# Patient Record
Sex: Female | Born: 1970 | Race: White | Hispanic: No | Marital: Married | Smoking: Never smoker
Health system: Southern US, Community
[De-identification: ages and names within clinical notes are randomized; demographics above are authoritative.]

## PROBLEM LIST (undated history)

## (undated) DIAGNOSIS — R51 Headache: Secondary | ICD-10-CM

## (undated) DIAGNOSIS — N6099 Unspecified benign mammary dysplasia of unspecified breast: Secondary | ICD-10-CM

## (undated) DIAGNOSIS — F419 Anxiety disorder, unspecified: Secondary | ICD-10-CM

## (undated) HISTORY — DX: Unspecified benign mammary dysplasia of unspecified breast: N60.99

---

## 1993-08-17 HISTORY — PX: OTHER SURGICAL HISTORY: SHX169

## 2007-01-13 ENCOUNTER — Encounter: Admission: RE | Admit: 2007-01-13 | Discharge: 2007-01-13 | Payer: Self-pay | Admitting: Family Medicine

## 2008-02-06 ENCOUNTER — Encounter: Admission: RE | Admit: 2008-02-06 | Discharge: 2008-02-06 | Payer: Self-pay | Admitting: Obstetrics and Gynecology

## 2009-02-14 ENCOUNTER — Encounter: Admission: RE | Admit: 2009-02-14 | Discharge: 2009-02-14 | Payer: Self-pay | Admitting: Endocrinology

## 2010-09-07 ENCOUNTER — Encounter: Payer: Self-pay | Admitting: Endocrinology

## 2010-11-17 ENCOUNTER — Other Ambulatory Visit: Payer: Self-pay | Admitting: Obstetrics and Gynecology

## 2010-11-17 DIAGNOSIS — Z1231 Encounter for screening mammogram for malignant neoplasm of breast: Secondary | ICD-10-CM

## 2010-11-25 ENCOUNTER — Ambulatory Visit
Admission: RE | Admit: 2010-11-25 | Discharge: 2010-11-25 | Disposition: A | Discharge status: Home / Self Care | Payer: BLUE CROSS/BLUE SHIELD | Source: Ambulatory Visit | Attending: Obstetrics and Gynecology | Admitting: Obstetrics and Gynecology

## 2010-11-25 DIAGNOSIS — Z1231 Encounter for screening mammogram for malignant neoplasm of breast: Secondary | ICD-10-CM

## 2011-12-16 ENCOUNTER — Other Ambulatory Visit: Payer: Self-pay | Admitting: Obstetrics and Gynecology

## 2011-12-16 DIAGNOSIS — Z1231 Encounter for screening mammogram for malignant neoplasm of breast: Secondary | ICD-10-CM

## 2011-12-29 ENCOUNTER — Ambulatory Visit
Admission: RE | Admit: 2011-12-29 | Discharge: 2011-12-29 | Disposition: A | Discharge status: Home / Self Care | Payer: BLUE CROSS/BLUE SHIELD | Source: Ambulatory Visit | Attending: Obstetrics and Gynecology | Admitting: Obstetrics and Gynecology

## 2011-12-29 DIAGNOSIS — Z1231 Encounter for screening mammogram for malignant neoplasm of breast: Secondary | ICD-10-CM

## 2011-12-31 ENCOUNTER — Other Ambulatory Visit: Payer: Self-pay | Admitting: Obstetrics and Gynecology

## 2011-12-31 DIAGNOSIS — R928 Other abnormal and inconclusive findings on diagnostic imaging of breast: Secondary | ICD-10-CM

## 2012-01-08 ENCOUNTER — Ambulatory Visit
Admission: RE | Admit: 2012-01-08 | Discharge: 2012-01-08 | Disposition: A | Discharge status: Home / Self Care | Payer: BC Managed Care – PPO | Source: Ambulatory Visit | Attending: Obstetrics and Gynecology | Admitting: Obstetrics and Gynecology

## 2012-01-08 ENCOUNTER — Other Ambulatory Visit: Payer: Self-pay | Admitting: Obstetrics and Gynecology

## 2012-01-08 DIAGNOSIS — R921 Mammographic calcification found on diagnostic imaging of breast: Secondary | ICD-10-CM

## 2012-01-08 DIAGNOSIS — R928 Other abnormal and inconclusive findings on diagnostic imaging of breast: Secondary | ICD-10-CM

## 2012-01-14 ENCOUNTER — Ambulatory Visit
Admission: RE | Admit: 2012-01-14 | Discharge: 2012-01-14 | Disposition: A | Discharge status: Home / Self Care | Payer: BC Managed Care – PPO | Source: Ambulatory Visit | Attending: Obstetrics and Gynecology | Admitting: Obstetrics and Gynecology

## 2012-01-14 DIAGNOSIS — R921 Mammographic calcification found on diagnostic imaging of breast: Secondary | ICD-10-CM

## 2012-01-21 ENCOUNTER — Other Ambulatory Visit: Payer: Self-pay | Admitting: Endocrinology

## 2012-01-21 DIAGNOSIS — E049 Nontoxic goiter, unspecified: Secondary | ICD-10-CM

## 2012-02-02 ENCOUNTER — Ambulatory Visit
Admission: RE | Admit: 2012-02-02 | Discharge: 2012-02-02 | Disposition: A | Discharge status: Home / Self Care | Payer: BC Managed Care – PPO | Source: Ambulatory Visit | Attending: Endocrinology | Admitting: Endocrinology

## 2012-02-02 ENCOUNTER — Other Ambulatory Visit: Payer: BC Managed Care – PPO

## 2012-02-02 DIAGNOSIS — E049 Nontoxic goiter, unspecified: Secondary | ICD-10-CM

## 2012-02-09 ENCOUNTER — Other Ambulatory Visit (INDEPENDENT_AMBULATORY_CARE_PROVIDER_SITE_OTHER): Payer: Self-pay | Admitting: Surgery

## 2012-02-09 ENCOUNTER — Ambulatory Visit (INDEPENDENT_AMBULATORY_CARE_PROVIDER_SITE_OTHER): Payer: BC Managed Care – PPO | Admitting: Surgery

## 2012-02-09 ENCOUNTER — Encounter (INDEPENDENT_AMBULATORY_CARE_PROVIDER_SITE_OTHER): Payer: Self-pay | Admitting: Surgery

## 2012-02-09 VITALS — BP 128/70 | HR 68 | Temp 97.4°F | Resp 14 | Ht 62.0 in | Wt 150.5 lb

## 2012-02-09 DIAGNOSIS — N62 Hypertrophy of breast: Secondary | ICD-10-CM

## 2012-02-09 DIAGNOSIS — N6099 Unspecified benign mammary dysplasia of unspecified breast: Secondary | ICD-10-CM

## 2012-02-09 NOTE — Progress Notes (Signed)
Patient ID: Kaitlyn Ward, female   DOB: [birth year], 41 y.o.   MRN: [id number]  Chief Complaint  Patient presents with  . New Evaluation    Atypical lobular hyperplasia    HPI Kaitlyn Ward is a 41 y.o. female.  She is referred by Dr. Mayford Knife for evaluation of atypical cells in the left breast. She had her routine screening mammography which demonstrated abnormal calcifications left breast prompting a biopsy. She is otherwise without complaints. She denies nipple discharge. There is no real family history of breast cancer HPI  Past Medical History  Diagnosis Date  . Atypical lobular hyperplasia of breast     Past Surgical History  Procedure Date  . Fracture repair 1995    left arm    Family History  Problem Relation Age of Onset  . Cancer Father     prostate  . Cancer Paternal Aunt     melanoma  . Cancer Maternal Grandmother     ovarian    Social History History  Substance Use Topics  . Smoking status: Never Smoker   . Smokeless tobacco: Never Used  . Alcohol Use: Yes     occasional    No Known Allergies  Current Outpatient Prescriptions  Medication Sig Dispense Refill  . Cholecalciferol (VITAMIN D) 2000 UNITS CAPS Take by mouth as needed.      . Coenzyme Q10 (COQ-10) 400 MG CAPS Take by mouth as needed.      Marland Kitchen escitalopram (LEXAPRO) 20 MG tablet Take 20 mg by mouth daily.      . Lecithin 1200 MG CAPS Take by mouth as needed.      Marland Kitchen lisdexamfetamine (VYVANSE) 20 MG capsule Take 20 mg by mouth as needed.      . Omega-3 Fatty Acids 500 MG CPDR Take by mouth as needed.      . Probiotic Product (PROBIOTIC FORMULA PO) Take by mouth as needed. Super Bifido Plus        Review of Systems Review of Systems  Constitutional: Negative for fever, chills and unexpected weight change.  HENT: Negative for hearing loss, congestion, sore throat, trouble swallowing and voice change.   Eyes: Negative for visual disturbance.  Respiratory: Negative for cough and wheezing.     Cardiovascular: Negative for chest pain, palpitations and leg swelling.  Gastrointestinal: Negative for nausea, vomiting, abdominal pain, diarrhea, constipation, blood in stool, abdominal distention and anal bleeding.  Genitourinary: Negative for hematuria, vaginal bleeding and difficulty urinating.  Musculoskeletal: Negative for arthralgias.  Skin: Negative for rash and wound.  Neurological: Negative for seizures, syncope and headaches.  Hematological: Negative for adenopathy. Does not bruise/bleed easily.  Psychiatric/Behavioral: Negative for confusion.    Blood pressure 128/70, pulse 68, temperature 97.4 F (36.3 C), temperature source Temporal, resp. rate 14, height 5\' 2"  (1.575 m), weight 150 lb 8 oz (68.266 kg).  Physical Exam Physical Exam  Constitutional: She is oriented to person, place, and time. She appears well-developed and well-nourished. No distress.  HENT:  Head: Normocephalic and atraumatic.  Right Ear: External ear normal.  Left Ear: External ear normal.  Nose: Nose normal.  Mouth/Throat: Oropharynx is clear and moist.  Eyes: Conjunctivae and EOM are normal. Pupils are equal, round, and reactive to light.  Neck: Normal range of motion. Neck supple. No tracheal deviation present. No thyromegaly present.  Cardiovascular: Normal rate, regular rhythm, normal heart sounds and intact distal pulses.   No murmur heard. Pulmonary/Chest: Effort normal and breath sounds normal. No respiratory distress.  She has no wheezes. She has no rales.  Lymphadenopathy:    She has no cervical adenopathy.    She has no axillary adenopathy.  Neurological: She is alert and oriented to person, place, and time.  Skin: Skin is warm and dry. No rash noted. She is not diaphoretic. No pallor.  Psychiatric: Her behavior is normal. Judgment normal.  Breast: There is no palpable mass in left breast. Her biopsy incision is well-healed. Areola is normal  Data Reviewed I have reviewed the pathology  demonstrating atypical lobular hyperplasia of left breast biopsy specimen. I have reviewed the mammograms as well.  Assessment    Atypical lobular hyperplasia of left breast    Plan    Needle localized excision of this area is recommended to rule out malignancy. I discussed this with the patient and her husband in detail. I discussed the risks of surgery which includes but is not limited to bleeding, infection, need for further surgery, et Karie Soda. She understands and wishes to proceed. Surgery plus the schedule. Likelihood of success is good       Adarrius Graeff A 02/09/2012, 11:35 AM

## 2012-03-14 ENCOUNTER — Encounter (HOSPITAL_COMMUNITY)
Admission: RE | Admit: 2012-03-14 | Discharge: 2012-03-14 | Disposition: A | Discharge status: Home / Self Care | Payer: BC Managed Care – PPO | Source: Ambulatory Visit | Attending: Surgery | Admitting: Surgery

## 2012-03-14 ENCOUNTER — Encounter (HOSPITAL_COMMUNITY): Payer: Self-pay

## 2012-03-14 HISTORY — DX: Anxiety disorder, unspecified: F41.9

## 2012-03-14 HISTORY — DX: Headache: R51

## 2012-03-14 LAB — CBC
HCT: 39.3 % (ref 36.0–46.0)
Hemoglobin: 13.3 g/dL (ref 12.0–15.0)
MCHC: 33.8 g/dL (ref 30.0–36.0)
MCV: 88.7 fL (ref 78.0–100.0)
RDW: 12.3 % (ref 11.5–15.5)

## 2012-03-14 LAB — BASIC METABOLIC PANEL
BUN: 13 mg/dL (ref 6–23)
CO2: 31 mEq/L (ref 19–32)
Chloride: 101 mEq/L (ref 96–112)
Creatinine, Ser: 0.88 mg/dL (ref 0.50–1.10)
GFR calc Af Amer: >90 mL/min (ref >90)
Glucose, Bld: 108 mg/dL — ABNORMAL HIGH (ref 70–99)
Potassium: 4.6 mEq/L (ref 3.5–5.1)

## 2012-03-14 LAB — SURGICAL PCR SCREEN: Staphylococcus aureus: POSITIVE — AB

## 2012-03-14 NOTE — Pre-Procedure Instructions (Signed)
20 Kaitlyn Ward  03/14/2012   Your procedure is scheduled on: Friday , August 2nd.  Report to [facility]   after your appointment at the [facility] at 7:30AM.  Call this number if you have problems the morning of surgery: [phone]   Remember:   Do not eat food or drink any liquid:  After Midnight.   Take these medicines the morning of surgery with A SIP OF WATER: Escitalopram (Lexapro) Do not take any Aspirin, NSAIDs , Coumadin, Plavix, Effient or Herbal medications.   Do not wear jewelry, make-up or Ward polish.  Do not wear lotions, powders, or perfumes. You may wear deodorant.  Do not shave 48 hours prior to surgery. Men may shave face and neck.  Do not bring valuables to the hospital.  Contacts, dentures or bridgework may not be worn into surgery.  Leave suitcase in the car. After surgery it may be brought to your room.  For patients admitted to the hospital, checkout time is 11:00 AM the day of discharge.   Patients discharged the day of surgery will not be allowed to drive home.  Name and phone number of your driver: __________________  Special Instructions: CHG Shower Use Special Wash: 1/2 bottle night before surgery and 1/2 bottle morning of surgery.   Please read over the following fact sheets that you were given: Pain Booklet, Coughing and Deep Breathing and Surgical Site Infection Prevention

## 2012-03-17 MED ORDER — CEFAZOLIN SODIUM-DEXTROSE 2-3 GM-% IV SOLR
2.0000 g | INTRAVENOUS | Status: AC
  Administered 2012-03-18: 2 g via INTRAVENOUS
  Filled 2012-03-17: qty 50

## 2012-03-17 MED ORDER — CEFAZOLIN SODIUM 1-5 GM-% IV SOLN: 1.0000 g | INTRAVENOUS | Status: DC

## 2012-03-17 NOTE — H&P (Signed)
Patient ID: Kaitlyn Ward, female DOB: 06/13/1971, 41 y.o. MRN: [id number]  Chief Complaint   Patient presents with   .  New Evaluation     Atypical lobular hyperplasia    HPI  Kaitlyn Ward is a 41 y.o. female. She is referred by Dr. Mayford Knife for evaluation of atypical cells in the left breast. She had her routine screening mammography which demonstrated abnormal calcifications left breast prompting a biopsy. She is otherwise without complaints. She denies nipple discharge. There is no real family history of breast cancer  HPI  Past Medical History   Diagnosis  Date   .  Atypical lobular hyperplasia of breast     Past Surgical History   Procedure  Date   .  Fracture repair  1995     left arm    Family History   Problem  Relation  Age of Onset   .  Cancer  Father       prostate    .  Cancer  Paternal Aunt       melanoma    .  Cancer  Maternal Grandmother       ovarian    Social History  History   Substance Use Topics   .  Smoking status:  Never Smoker   .  Smokeless tobacco:  Never Used   .  Alcohol Use:  Yes      occasional    No Known Allergies  Current Outpatient Prescriptions   Medication  Sig  Dispense  Refill   .  Cholecalciferol (VITAMIN D) 2000 UNITS CAPS  Take by mouth as needed.     .  Coenzyme Q10 (COQ-10) 400 MG CAPS  Take by mouth as needed.     Marland Kitchen  escitalopram (LEXAPRO) 20 MG tablet  Take 20 mg by mouth daily.     .  Lecithin 1200 MG CAPS  Take by mouth as needed.     Marland Kitchen  lisdexamfetamine (VYVANSE) 20 MG capsule  Take 20 mg by mouth as needed.     .  Omega-3 Fatty Acids 500 MG CPDR  Take by mouth as needed.     .  Probiotic Product (PROBIOTIC FORMULA PO)  Take by mouth as needed. Super Bifido Plus      Review of Systems  Review of Systems  Constitutional: Negative for fever, chills and unexpected weight change.  HENT: Negative for hearing loss, congestion, sore throat, trouble swallowing and voice change.  Eyes: Negative for visual disturbance.    Respiratory: Negative for cough and wheezing.  Cardiovascular: Negative for chest pain, palpitations and leg swelling.  Gastrointestinal: Negative for nausea, vomiting, abdominal pain, diarrhea, constipation, blood in stool, abdominal distention and anal bleeding.  Genitourinary: Negative for hematuria, vaginal bleeding and difficulty urinating.  Musculoskeletal: Negative for arthralgias.  Skin: Negative for rash and wound.  Neurological: Negative for seizures, syncope and headaches.  Hematological: Negative for adenopathy. Does not bruise/bleed easily.  Psychiatric/Behavioral: Negative for confusion.   Blood pressure 128/70, pulse 68, temperature 97.4 F (36.3 C), temperature source Temporal, resp. rate 14, height 5\' 2"  (1.575 m), weight 150 lb 8 oz (68.266 kg).  Physical Exam  Physical Exam  Constitutional: She is oriented to person, place, and time. She appears well-developed and well-nourished. No distress.  HENT:  Head: Normocephalic and atraumatic.  Right Ear: External ear normal.  Left Ear: External ear normal.  Nose: Nose normal.  Mouth/Throat: Oropharynx is clear and moist.  Eyes: Conjunctivae and EOM  are normal. Pupils are equal, round, and reactive to light.  Neck: Normal range of motion. Neck supple. No tracheal deviation present. No thyromegaly present.  Cardiovascular: Normal rate, regular rhythm, normal heart sounds and intact distal pulses.  No murmur heard.  Pulmonary/Chest: Effort normal and breath sounds normal. No respiratory distress. She has no wheezes. She has no rales.  Lymphadenopathy:  She has no cervical adenopathy.  She has no axillary adenopathy.  Neurological: She is alert and oriented to person, place, and time.  Skin: Skin is warm and dry. No rash noted. She is not diaphoretic. No pallor.  Psychiatric: Her behavior is normal. Judgment normal.  Breast: There is no palpable mass in left breast. Her biopsy incision is well-healed. Areola is normal  Data  Reviewed  I have reviewed the pathology demonstrating atypical lobular hyperplasia of left breast biopsy specimen. I have reviewed the mammograms as well.  Assessment   Atypical lobular hyperplasia of left breast   Plan   Needle localized excision of this area is recommended to rule out malignancy. I discussed this with the patient and her husband in detail. I discussed the risks of surgery which includes but is not limited to bleeding, infection, need for further surgery, et Karie Soda. She understands and wishes to proceed. Surgery plus the schedule. Likelihood of success is good   Stellah Donovan A

## 2012-03-18 ENCOUNTER — Ambulatory Visit (HOSPITAL_COMMUNITY): Payer: BC Managed Care – PPO | Admitting: Anesthesiology

## 2012-03-18 ENCOUNTER — Encounter (HOSPITAL_COMMUNITY): Payer: Self-pay | Admitting: Anesthesiology

## 2012-03-18 ENCOUNTER — Encounter (HOSPITAL_COMMUNITY)
Admission: RE | Disposition: A | Discharge status: Home / Self Care | Payer: Self-pay | Source: Ambulatory Visit | Attending: Surgery

## 2012-03-18 ENCOUNTER — Ambulatory Visit
Admission: RE | Admit: 2012-03-18 | Discharge: 2012-03-18 | Disposition: A | Discharge status: Home / Self Care | Payer: BC Managed Care – PPO | Source: Ambulatory Visit | Attending: Surgery | Admitting: Surgery

## 2012-03-18 ENCOUNTER — Encounter (HOSPITAL_COMMUNITY): Payer: Self-pay | Admitting: *Deleted

## 2012-03-18 ENCOUNTER — Ambulatory Visit (HOSPITAL_COMMUNITY)
Admission: RE | Admit: 2012-03-18 | Discharge: 2012-03-18 | Disposition: A | Discharge status: Home / Self Care | Payer: BC Managed Care – PPO | Source: Ambulatory Visit | Attending: Surgery | Admitting: Surgery

## 2012-03-18 DIAGNOSIS — N6019 Diffuse cystic mastopathy of unspecified breast: Secondary | ICD-10-CM

## 2012-03-18 DIAGNOSIS — R928 Other abnormal and inconclusive findings on diagnostic imaging of breast: Secondary | ICD-10-CM | POA: Insufficient documentation

## 2012-03-18 DIAGNOSIS — N6099 Unspecified benign mammary dysplasia of unspecified breast: Secondary | ICD-10-CM

## 2012-03-18 DIAGNOSIS — Z01812 Encounter for preprocedural laboratory examination: Secondary | ICD-10-CM | POA: Insufficient documentation

## 2012-03-18 DIAGNOSIS — R51 Headache: Secondary | ICD-10-CM | POA: Insufficient documentation

## 2012-03-18 DIAGNOSIS — Z01818 Encounter for other preprocedural examination: Secondary | ICD-10-CM | POA: Insufficient documentation

## 2012-03-18 LAB — GLUCOSE, CAPILLARY: Glucose-Capillary: 83 mg/dL (ref 70–99)

## 2012-03-18 SURGERY — BREAST LUMPECTOMY WITH NEEDLE LOCALIZATION
Anesthesia: General | Site: Breast | Laterality: Left | Wound class: Clean

## 2012-03-18 MED ORDER — LIDOCAINE HCL (CARDIAC) 20 MG/ML IV SOLN
INTRAVENOUS | Status: DC | PRN
  Administered 2012-03-18: 70 mg via INTRAVENOUS

## 2012-03-18 MED ORDER — OXYCODONE HCL 5 MG PO TABS: 5.0000 mg | ORAL_TABLET | ORAL | Status: DC | PRN

## 2012-03-18 MED ORDER — BUPIVACAINE-EPINEPHRINE 0.25% -1:200000 IJ SOLN
INTRAMUSCULAR | Status: DC | PRN
  Administered 2012-03-18: 20 mL

## 2012-03-18 MED ORDER — ONDANSETRON HCL 4 MG/2ML IJ SOLN
INTRAMUSCULAR | Status: DC | PRN
  Administered 2012-03-18: 4 mg via INTRAVENOUS

## 2012-03-18 MED ORDER — ACETAMINOPHEN 650 MG RE SUPP: 650.0000 mg | RECTAL | Status: DC | PRN

## 2012-03-18 MED ORDER — MORPHINE SULFATE 2 MG/ML IJ SOLN: 2.0000 mg | INTRAMUSCULAR | Status: DC | PRN

## 2012-03-18 MED ORDER — ONDANSETRON HCL 4 MG/2ML IJ SOLN: 4.0000 mg | Freq: Four times a day (QID) | INTRAMUSCULAR | Status: DC | PRN

## 2012-03-18 MED ORDER — HYDROMORPHONE HCL PF 1 MG/ML IJ SOLN: 0.2500 mg | INTRAMUSCULAR | Status: DC | PRN

## 2012-03-18 MED ORDER — MIDAZOLAM HCL 5 MG/5ML IJ SOLN
INTRAMUSCULAR | Status: DC | PRN
  Administered 2012-03-18: 2 mg via INTRAVENOUS

## 2012-03-18 MED ORDER — FENTANYL CITRATE 0.05 MG/ML IJ SOLN
INTRAMUSCULAR | Status: DC | PRN
  Administered 2012-03-18: 50 ug via INTRAVENOUS
  Administered 2012-03-18: 100 ug via INTRAVENOUS

## 2012-03-18 MED ORDER — SODIUM CHLORIDE 0.9 % IV SOLN: 250.0000 mL | INTRAVENOUS | Status: DC | PRN

## 2012-03-18 MED ORDER — SODIUM CHLORIDE 0.9 % IJ SOLN: 3.0000 mL | INTRAMUSCULAR | Status: DC | PRN

## 2012-03-18 MED ORDER — GLYCOPYRROLATE 0.2 MG/ML IJ SOLN
INTRAMUSCULAR | Status: DC | PRN
  Administered 2012-03-18: 0.2 mg via INTRAVENOUS

## 2012-03-18 MED ORDER — ACETAMINOPHEN 325 MG PO TABS: 650.0000 mg | ORAL_TABLET | ORAL | Status: DC | PRN

## 2012-03-18 MED ORDER — 0.9 % SODIUM CHLORIDE (POUR BTL) OPTIME
TOPICAL | Status: DC | PRN
  Administered 2012-03-18: 1000 mL

## 2012-03-18 MED ORDER — PROPOFOL 10 MG/ML IV EMUL
INTRAVENOUS | Status: DC | PRN
  Administered 2012-03-18: 200 mg via INTRAVENOUS

## 2012-03-18 MED ORDER — HYDROCODONE-ACETAMINOPHEN 5-325 MG PO TABS: 1.0000 | ORAL_TABLET | ORAL | Status: AC | PRN

## 2012-03-18 MED ORDER — KETOROLAC TROMETHAMINE 30 MG/ML IJ SOLN
INTRAMUSCULAR | Status: DC | PRN
  Administered 2012-03-18: 30 mg via INTRAVENOUS

## 2012-03-18 MED ORDER — LACTATED RINGERS IV SOLN
INTRAVENOUS | Status: DC
  Administered 2012-03-18: 09:00:00 via INTRAVENOUS

## 2012-03-18 MED ORDER — LACTATED RINGERS IV SOLN
INTRAVENOUS | Status: DC | PRN
  Administered 2012-03-18: 09:00:00 via INTRAVENOUS

## 2012-03-18 MED ORDER — BUPIVACAINE-EPINEPHRINE PF 0.25-1:200000 % IJ SOLN
INTRAMUSCULAR | Status: AC
  Filled 2012-03-18: qty 30

## 2012-03-18 MED ORDER — SODIUM CHLORIDE 0.9 % IJ SOLN: 3.0000 mL | Freq: Two times a day (BID) | INTRAMUSCULAR | Status: DC

## 2012-03-18 SURGICAL SUPPLY — 41 items
APL SKNCLS STERI-STRIP NONHPOA (GAUZE/BANDAGES/DRESSINGS) ×1
BENZOIN TINCTURE PRP APPL 2/3 (GAUZE/BANDAGES/DRESSINGS) ×2 IMPLANT
BLADE SURG 10 STRL SS (BLADE) ×2 IMPLANT
BLADE SURG 15 STRL LF DISP TIS (BLADE) ×1 IMPLANT
BLADE SURG 15 STRL SS (BLADE) ×2
CANISTER SUCTION 2500CC (MISCELLANEOUS) ×1 IMPLANT
CHLORAPREP W/TINT 26ML (MISCELLANEOUS) ×2 IMPLANT
CLOTH BEACON ORANGE TIMEOUT ST (SAFETY) ×2 IMPLANT
COVER SURGICAL LIGHT HANDLE (MISCELLANEOUS) ×2 IMPLANT
DEVICE DUBIN SPECIMEN MAMMOGRA (MISCELLANEOUS) ×2 IMPLANT
DRAPE CHEST BREAST 15X10 FENES (DRAPES) ×2 IMPLANT
DRSG TEGADERM 4X4.75 (GAUZE/BANDAGES/DRESSINGS) ×2 IMPLANT
ELECT CAUTERY BLADE 6.4 (BLADE) ×2 IMPLANT
ELECT REM PT RETURN 9FT ADLT (ELECTROSURGICAL) ×2
ELECTRODE REM PT RTRN 9FT ADLT (ELECTROSURGICAL) ×1 IMPLANT
GLOVE BIO SURGEON STRL SZ7.5 (GLOVE) ×1 IMPLANT
GLOVE BIOGEL PI IND STRL 7.5 (GLOVE) IMPLANT
GLOVE BIOGEL PI INDICATOR 7.5 (GLOVE) ×2
GLOVE SURG SIGNA 7.5 PF LTX (GLOVE) ×2 IMPLANT
GOWN PREVENTION PLUS XLARGE (GOWN DISPOSABLE) ×2 IMPLANT
GOWN STRL NON-REIN LRG LVL3 (GOWN DISPOSABLE) ×3 IMPLANT
KIT BASIN OR (CUSTOM PROCEDURE TRAY) ×2 IMPLANT
KIT MARKER MARGIN INK (KITS) ×1 IMPLANT
KIT ROOM TURNOVER OR (KITS) ×2 IMPLANT
NS IRRIG 1000ML POUR BTL (IV SOLUTION) ×2 IMPLANT
PACK SURGICAL SETUP 50X90 (CUSTOM PROCEDURE TRAY) ×2 IMPLANT
PAD ARMBOARD 7.5X6 YLW CONV (MISCELLANEOUS) ×2 IMPLANT
PENCIL BUTTON HOLSTER BLD 10FT (ELECTRODE) ×2 IMPLANT
SPONGE GAUZE 4X4 12PLY (GAUZE/BANDAGES/DRESSINGS) ×2 IMPLANT
SPONGE LAP 4X18 X RAY DECT (DISPOSABLE) ×2 IMPLANT
STRIP CLOSURE SKIN 1/2X4 (GAUZE/BANDAGES/DRESSINGS) ×2 IMPLANT
SUT MON AB 4-0 PC3 18 (SUTURE) ×2 IMPLANT
SUT SILK 2 0 SH (SUTURE) IMPLANT
SUT VIC AB 3-0 SH 27 (SUTURE) ×2
SUT VIC AB 3-0 SH 27XBRD (SUTURE) ×1 IMPLANT
SYR BULB 3OZ (MISCELLANEOUS) ×2 IMPLANT
SYR CONTROL 10ML LL (SYRINGE) ×2 IMPLANT
TOWEL OR 17X24 6PK STRL BLUE (TOWEL DISPOSABLE) ×2 IMPLANT
TOWEL OR 17X26 10 PK STRL BLUE (TOWEL DISPOSABLE) ×1 IMPLANT
TUBE CONNECTING 12X1/4 (SUCTIONS) ×1 IMPLANT
YANKAUER SUCT BULB TIP NO VENT (SUCTIONS) ×1 IMPLANT

## 2012-03-18 NOTE — Transfer of Care (Signed)
Immediate Anesthesia Transfer of Care Note  Patient: Kaitlyn Ward  Procedure(s) Performed: Procedure(s) (LRB): BREAST LUMPECTOMY WITH NEEDLE LOCALIZATION (Left)  Patient Location: PACU  Anesthesia Type: General  Level of Consciousness: awake and alert   Airway & Oxygen Therapy: Patient Spontanous Breathing and Patient connected to nasal cannula oxygen  Post-op Assessment: Report given to PACU RN and Post -op Vital signs reviewed and stable  Post vital signs: Reviewed and stable  Complications: No apparent anesthesia complications

## 2012-03-18 NOTE — Anesthesia Preprocedure Evaluation (Addendum)
Anesthesia Evaluation  Patient identified by MRN, date of birth, ID band Patient awake    Reviewed: Allergy & Precautions, H&P , NPO status , Patient's Chart, lab work & pertinent test results  Airway Mallampati: II      Dental   Pulmonary  breath sounds clear to auscultation        Cardiovascular negative cardio ROS  Rhythm:Regular Rate:Normal     Neuro/Psych  Headaches,    GI/Hepatic negative GI ROS, Neg liver ROS,   Endo/Other  negative endocrine ROSType 2Borderline DM  Renal/GU negative Renal ROS     Musculoskeletal   Abdominal   Peds  Hematology   Anesthesia Other Findings   Reproductive/Obstetrics                          Anesthesia Physical Anesthesia Plan  ASA: III  Anesthesia Plan: General   Post-op Pain Management:    Induction: Intravenous  Airway Management Planned: LMA  Additional Equipment:   Intra-op Plan:   Post-operative Plan: Extubation in OR  Informed Consent:   Dental advisory given  Plan Discussed with: CRNA, Anesthesiologist and Surgeon  Anesthesia Plan Comments:         Anesthesia Quick Evaluation

## 2012-03-18 NOTE — Op Note (Signed)
BREAST LUMPECTOMY WITH NEEDLE LOCALIZATION  Procedure Note  Kaitlyn Ward 03/18/2012   Pre-op Diagnosis: ATYPICAL LYMPHOID CELLS, LEFT BREAST     Post-op Diagnosis: same  Procedure(s): LEFT BREAST LUMPECTOMY WITH NEEDLE LOCALIZATION  Surgeon(s): Shelly Rubenstein, MD  Anesthesia: General  Staff:  Pauletta Browns, RN - Circulator Janeece Agee Pingue, CST - Scrub Person  Estimated Blood Loss: Minimal               Specimens: left breast   Indications: This is Ward 41 year old female who had Ward recent left breast biopsy by stereotactic guidance which showed atypical lymphoid hyperplasia. The decision has been made to proceed with needle localized lumpectomy  Procedure: The patient had Ward localization wire placed in the left breast at the breast center. She was then taken to the operating room. She was placed upon the operative table and general anesthesia was induced. She was identified as the correct patient. Her left breast and prepped and draped in the usual sterile fashion. I made Ward longitudinal incision across the breast in the lower outer quadrant at the level of the wire. I did this down to breast tissue with electrocautery. I then performed Ward wide lumpectomy going down to the chest wall around the localization wire. Adequate margins appear to be achieved. I then completely removed the specimen with the cautery. All margins were marked for pain. X-ray confirmed the suspicious area was in the lumpectomy specimen. It was then sent to pathology for evaluation. I then irrigated with saline achieve hemostasis the cautery. I anesthetized the area circumferentially with Marcaine. I then closed subcutaneous tissue with interrupted 3-0 Vicryl sutures and closed the skin with Ward running 4-0 Monocryl. Steri-Strips, gauze, and Tegaderm were then applied. The patient tolerated the procedure well. All sponge tenderness counts were correct at the end of the procedure. The patient was then extubated  in the operating room and taken in Ward stable condition to the recovery room.        Kaitlyn Ward   Date: 03/18/2012  Time: 10:09 AM

## 2012-03-18 NOTE — Anesthesia Postprocedure Evaluation (Signed)
  Anesthesia Post-op Note  Patient: Kaitlyn Ward  Procedure(s) Performed: Procedure(s) (LRB): BREAST LUMPECTOMY WITH NEEDLE LOCALIZATION (Left)  Patient Location: PACU  Anesthesia Type: General  Level of Consciousness: awake  Airway and Oxygen Therapy: Patient Spontanous Breathing  Post-op Pain: mild  Post-op Assessment: Post-op Vital signs reviewed  Post-op Vital Signs: Reviewed  Complications: No apparent anesthesia complications

## 2012-03-18 NOTE — Preoperative (Signed)
Beta Blockers   Reason not to administer Beta Blockers:Not Applicable 

## 2012-03-18 NOTE — Anesthesia Procedure Notes (Signed)
Procedure Name: LMA Insertion Date/Time: 03/18/2012 9:36 AM Performed by: Gwenyth Allegra Pre-anesthesia Checklist: Patient identified, Timeout performed, Emergency Drugs available, Suction available and Patient being monitored Patient Re-evaluated:Patient Re-evaluated prior to inductionOxygen Delivery Method: Circle system utilized Intubation Type: IV induction LMA: LMA inserted LMA Size: 4.0 Number of attempts: 1 Placement Confirmation: positive ETCO2 and breath sounds checked- equal and bilateral Tube secured with: Tape Dental Injury: Teeth and Oropharynx as per pre-operative assessment

## 2012-03-18 NOTE — Interval H&P Note (Signed)
History and Physical Interval Note: no change in h and p 03/18/2012 8:59 AM  [name]  has presented today for surgery, with the diagnosis of atypical cells left breast  The various methods of treatment have been discussed with the patient and family. After consideration of risks, benefits and other options for treatment, the patient has consented to  Procedure(s) (LRB): BREAST LUMPECTOMY WITH NEEDLE LOCALIZATION (Left) as Ward surgical intervention .  The patient's history has been reviewed, patient examined, no change in status, stable for surgery.  I have reviewed the patient's chart and labs.  Questions were answered to the patient's satisfaction.     Kaitlyn Ward

## 2012-04-25 ENCOUNTER — Encounter (INDEPENDENT_AMBULATORY_CARE_PROVIDER_SITE_OTHER): Payer: BC Managed Care – PPO | Admitting: Surgery

## 2012-05-09 ENCOUNTER — Encounter (INDEPENDENT_AMBULATORY_CARE_PROVIDER_SITE_OTHER): Payer: BC Managed Care – PPO | Admitting: Surgery

## 2012-11-24 ENCOUNTER — Other Ambulatory Visit: Payer: Self-pay | Admitting: Obstetrics and Gynecology

## 2012-11-24 DIAGNOSIS — Z139 Encounter for screening, unspecified: Secondary | ICD-10-CM

## 2013-01-03 ENCOUNTER — Ambulatory Visit (INDEPENDENT_AMBULATORY_CARE_PROVIDER_SITE_OTHER): Payer: BC Managed Care – PPO

## 2013-01-03 DIAGNOSIS — Z139 Encounter for screening, unspecified: Secondary | ICD-10-CM

## 2013-01-03 DIAGNOSIS — Z1231 Encounter for screening mammogram for malignant neoplasm of breast: Secondary | ICD-10-CM

## 2013-10-11 ENCOUNTER — Emergency Department (HOSPITAL_BASED_OUTPATIENT_CLINIC_OR_DEPARTMENT_OTHER)
Admission: EM | Admit: 2013-10-11 | Discharge: 2013-10-11 | Disposition: A | Discharge status: Home / Self Care | Payer: BC Managed Care – PPO | Attending: Emergency Medicine | Admitting: Emergency Medicine

## 2013-10-11 ENCOUNTER — Encounter (HOSPITAL_BASED_OUTPATIENT_CLINIC_OR_DEPARTMENT_OTHER): Payer: Self-pay | Admitting: Emergency Medicine

## 2013-10-11 DIAGNOSIS — K5289 Other specified noninfective gastroenteritis and colitis: Secondary | ICD-10-CM | POA: Insufficient documentation

## 2013-10-11 DIAGNOSIS — K529 Noninfective gastroenteritis and colitis, unspecified: Secondary | ICD-10-CM

## 2013-10-11 DIAGNOSIS — Z8742 Personal history of other diseases of the female genital tract: Secondary | ICD-10-CM | POA: Insufficient documentation

## 2013-10-11 DIAGNOSIS — Z79899 Other long term (current) drug therapy: Secondary | ICD-10-CM | POA: Insufficient documentation

## 2013-10-11 DIAGNOSIS — E119 Type 2 diabetes mellitus without complications: Secondary | ICD-10-CM | POA: Insufficient documentation

## 2013-10-11 DIAGNOSIS — R51 Headache: Secondary | ICD-10-CM | POA: Insufficient documentation

## 2013-10-11 LAB — URINE MICROSCOPIC-ADD ON

## 2013-10-11 LAB — COMPREHENSIVE METABOLIC PANEL
ALK PHOS: 74 U/L (ref 39–117)
ALT: 16 U/L (ref 0–35)
AST: 21 U/L (ref 0–37)
Albumin: 4.5 g/dL (ref 3.5–5.2)
BUN: 23 mg/dL (ref 6–23)
CO2: 25 mEq/L (ref 19–32)
Calcium: 9.8 mg/dL (ref 8.4–10.5)
Chloride: 100 mEq/L (ref 96–112)
Creatinine, Ser: 1 mg/dL (ref 0.50–1.10)
GFR, EST AFRICAN AMERICAN: 79 mL/min — AB (ref >90)
GFR, EST NON AFRICAN AMERICAN: 68 mL/min — AB (ref >90)
GLUCOSE: 117 mg/dL — AB (ref 70–99)
POTASSIUM: 4.1 meq/L (ref 3.7–5.3)
Sodium: 140 mEq/L (ref 137–147)
TOTAL PROTEIN: 8.4 g/dL — AB (ref 6.0–8.3)
Total Bilirubin: 0.4 mg/dL (ref 0.3–1.2)

## 2013-10-11 LAB — I-STAT CG4 LACTIC ACID, ED: Lactic Acid, Venous: 0.9 mmol/L (ref 0.5–2.2)

## 2013-10-11 LAB — URINALYSIS, ROUTINE W REFLEX MICROSCOPIC
BILIRUBIN URINE: NEGATIVE
Glucose, UA: NEGATIVE mg/dL
KETONES UR: 15 mg/dL — AB
Leukocytes, UA: NEGATIVE
NITRITE: NEGATIVE
PROTEIN: NEGATIVE mg/dL
SPECIFIC GRAVITY, URINE: 1.018 (ref 1.005–1.030)
UROBILINOGEN UA: 0.2 mg/dL (ref 0.0–1.0)
pH: 5.5 (ref 5.0–8.0)

## 2013-10-11 LAB — CBC
HEMATOCRIT: 43.9 % (ref 36.0–46.0)
HEMOGLOBIN: 14.9 g/dL (ref 12.0–15.0)
MCH: 29.9 pg (ref 26.0–34.0)
MCHC: 33.9 g/dL (ref 30.0–36.0)
MCV: 88 fL (ref 78.0–100.0)
Platelets: 223 10*3/uL (ref 150–400)
RBC: 4.99 MIL/uL (ref 3.87–5.11)
RDW: 12.8 % (ref 11.5–15.5)
WBC: 9.2 10*3/uL (ref 4.0–10.5)

## 2013-10-11 LAB — LIPASE, BLOOD: LIPASE: 25 U/L (ref 11–59)

## 2013-10-11 MED ORDER — ONDANSETRON 4 MG PO TBDP: ORAL_TABLET | ORAL | Status: AC

## 2013-10-11 MED ORDER — SODIUM CHLORIDE 0.9 % IV BOLUS (SEPSIS)
2000.0000 mL | Freq: Once | INTRAVENOUS | Status: AC
  Administered 2013-10-11: 2000 mL via INTRAVENOUS

## 2013-10-11 MED ORDER — ONDANSETRON HCL 4 MG/2ML IJ SOLN
4.0000 mg | Freq: Once | INTRAMUSCULAR | Status: AC
  Administered 2013-10-11: 4 mg via INTRAVENOUS
  Filled 2013-10-11: qty 2

## 2013-10-11 MED ORDER — GI COCKTAIL ~~LOC~~
30.0000 mL | Freq: Once | ORAL | Status: AC
  Administered 2013-10-11: 30 mL via ORAL
  Filled 2013-10-11: qty 30

## 2013-10-11 MED ORDER — HYDROCOD POLST-CHLORPHEN POLST 10-8 MG/5ML PO LQCR: 5.0000 mL | Freq: Two times a day (BID) | ORAL | Status: AC | PRN

## 2013-10-11 NOTE — Discharge Instructions (Signed)
Viral Gastroenteritis Viral gastroenteritis is also known as stomach flu. This condition affects the stomach and intestinal tract. It can cause sudden diarrhea and vomiting. The illness typically lasts 3 to 8 days. Most people develop an immune response that eventually gets rid of the virus. While this natural response develops, the virus can make you quite ill. CAUSES  Many different viruses can cause gastroenteritis, such as rotavirus or noroviruses. You can catch one of these viruses by consuming contaminated food or water. You may also catch a virus by sharing utensils or other personal items with an infected person or by touching a contaminated surface. SYMPTOMS  The most common symptoms are diarrhea and vomiting. These problems can cause a severe loss of body fluids (dehydration) and a body salt (electrolyte) imbalance. Other symptoms may include:  Fever.  Headache.  Fatigue.  Abdominal pain. DIAGNOSIS  Your caregiver can usually diagnose viral gastroenteritis based on your symptoms and a physical exam. A stool sample may also be taken to test for the presence of viruses or other infections. TREATMENT  This illness typically goes away on its own. Treatments are aimed at rehydration. The most serious cases of viral gastroenteritis involve vomiting so severely that you are not able to keep fluids down. In these cases, fluids must be given through an intravenous line (IV). HOME CARE INSTRUCTIONS   Drink enough fluids to keep your urine clear or pale yellow. Drink small amounts of fluids frequently and increase the amounts as tolerated.  Ask your caregiver for specific rehydration instructions.  Avoid:  Foods high in sugar.  Alcohol.  Carbonated drinks.  Tobacco.  Juice.  Caffeine drinks.  Extremely hot or cold fluids.  Fatty, greasy foods.  Too much intake of anything at one time.  Dairy products until 24 to 48 hours after diarrhea stops.  You may consume probiotics.  Probiotics are active cultures of beneficial bacteria. They may lessen the amount and number of diarrheal stools in adults. Probiotics can be found in yogurt with active cultures and in supplements.  Wash your hands well to avoid spreading the virus.  Only take over-the-counter or prescription medicines for pain, discomfort, or fever as directed by your caregiver. Do not give aspirin to children. Antidiarrheal medicines are not recommended.  Ask your caregiver if you should continue to take your regular prescribed and over-the-counter medicines.  Keep all follow-up appointments as directed by your caregiver. SEEK IMMEDIATE MEDICAL CARE IF:   You are unable to keep fluids down.  You do not urinate at least once every 6 to 8 hours.  You develop shortness of breath.  You notice blood in your stool or vomit. This may look like coffee grounds.  You have abdominal pain that increases or is concentrated in one small area (localized).  You have persistent vomiting or diarrhea.  You have a fever.  The patient is a child younger than 3 months, and he or she has a fever.  The patient is a child older than 3 months, and he or she has a fever and persistent symptoms.  The patient is a child older than 3 months, and he or she has a fever and symptoms suddenly get worse.  The patient is a baby, and he or she has no tears when crying. MAKE SURE YOU:   Understand these instructions.  Will watch your condition.  Will get help right away if you are not doing well or get worse. Document Released: 08/03/2005 Document Revised: 10/26/2011 Document Reviewed: 05/20/2011   [employer] Patient Information 2014 [employer]  

## 2013-10-11 NOTE — ED Notes (Signed)
N/v/d/onset Monday and continues today

## 2013-10-11 NOTE — ED Notes (Signed)
Patient ambulatory to restroom without difficulty or assistance.

## 2013-10-11 NOTE — ED Provider Notes (Signed)
CSN: [id number]     Arrival date & time 10/11/13  0919 History   First MD Initiated Contact with Patient 10/11/13 [phone]     Chief Complaint  Patient presents with  . Nausea  . Emesis     (Consider location/radiation/quality/duration/timing/severity/associated sxs/prior Treatment) Patient is a 43 y.o. female presenting with vomiting. The history is provided by the patient.  Emesis Severity:  Moderate Duration:  3 days Timing:  Intermittent Quality:  Stomach contents Progression:  Unchanged Chronicity:  New Recent urination:  Decreased Relieved by:  Nothing Worsened by:  Nothing tried Associated symptoms: abdominal pain (burning at epigastrum), diarrhea (nonbloody) and headaches (last night)   Associated symptoms: no chills, no cough and no fever   Risk factors: sick contacts (son with similar symptoms)     Past Medical History  Diagnosis Date  . Atypical lobular hyperplasia of breast   . Anxiety   . Diabetes mellitus 2010    diet controlled; pt stopped Metformin 2-3 yrs ago  . QMVHQION(629.5Headache(784.0)    Past Surgical History  Procedure Laterality Date  . Fracture repair  1995    left arm   Family History  Problem Relation Age of Onset  . Cancer Father     prostate  . Cancer Paternal Aunt     melanoma  . Cancer Maternal Grandmother     ovarian   History  Substance Use Topics  . Smoking status: Never Smoker   . Smokeless tobacco: Never Used  . Alcohol Use: Yes     Comment: occasional   OB History   Grav Para Term Preterm Abortions TAB SAB Ect Mult Living                 Review of Systems  Constitutional: Negative for fever and chills.  Respiratory: Negative for cough and shortness of breath.   Cardiovascular: Negative for chest pain and leg swelling.  Gastrointestinal: Positive for nausea, vomiting, abdominal pain (burning at epigastrum) and diarrhea (nonbloody).  Neurological: Positive for headaches (last night).  All other systems reviewed and are  negative.      Allergies  Review of patient's allergies indicates no known allergies.  Home Medications   Current Outpatient Rx  Name  Route  Sig  Dispense  Refill  . Cholecalciferol (VITAMIN D) 2000 UNITS CAPS   Oral   Take 2,000 Units by mouth daily.          . Coenzyme Q10 (COQ-10) 400 MG CAPS   Oral   Take 400 mg by mouth daily.          Marland Kitchen. escitalopram (LEXAPRO) 20 MG tablet   Oral   Take 20 mg by mouth daily.         Marland Kitchen. escitalopram (LEXAPRO) 20 MG tablet   Oral   Take 20 mg by mouth daily.         . Omega-3 Fatty Acids (FISH OIL PO)   Oral   Take 1 capsule by mouth daily.         . Probiotic Product (PROBIOTIC DAILY PO)   Oral   Take 1 tablet by mouth daily.          BP 114/81  Pulse 102  Temp(Src) 97.7 F (36.5 C) (Oral)  Resp 18  Ht 5\' 2"  (1.575 m)  Wt 140 lb (63.504 kg)  BMI 25.60 kg/m2  SpO2 99%  LMP 02/02/2012 Physical Exam  Nursing note and vitals reviewed. Constitutional: She is oriented to person, place, and time.  She appears well-developed and well-nourished. No distress.  HENT:  Head: Normocephalic and atraumatic.  Eyes: EOM are normal. Pupils are equal, round, and reactive to light.  Neck: Normal range of motion. Neck supple.  Cardiovascular: Normal rate and regular rhythm.  Exam reveals no friction rub.   No murmur heard. Pulmonary/Chest: Effort normal and breath sounds normal. No respiratory distress. She has no wheezes. She has no rales.  Abdominal: Soft. She exhibits no distension and no mass. There is tenderness (very mild, diffuse). There is no rebound and no guarding.  Musculoskeletal: Normal range of motion. She exhibits no edema.  Neurological: She is alert and oriented to person, place, and time.  Skin: She is not diaphoretic.    ED Course  Procedures (including critical care time) Labs Review Labs Reviewed  COMPREHENSIVE METABOLIC PANEL - Abnormal; Notable for the following:    Glucose, Bld 117 (*)    Total  Protein 8.4 (*)    GFR calc non Af Amer 68 (*)    GFR calc Af Amer 79 (*)    All other components within normal limits  CBC  LIPASE, BLOOD  URINALYSIS, ROUTINE W REFLEX MICROSCOPIC  I-STAT CG4 LACTIC ACID, ED   Imaging Review No results found.  EKG Interpretation   None       MDM   Final diagnoses:  Gastroenteritis    61F here with symptoms concerning for gastroenteritis. Sick contact of son with similar symptoms. States N/V/D for 3 days, mildly decreased urinary output. Patient here with no fever, very mild tachycardia of 102. MMM, normal cap refill and skin turgor. Clinical picture c/w gastroenteritis. Patient given fluids, labs checked.  Labs normal. Feeling better after 2 L NS. Given Zofran to go home. Stable for discharge.  Dagmar Hait, MD 10/11/13 [phone]

## 2013-11-30 ENCOUNTER — Other Ambulatory Visit: Payer: Self-pay | Admitting: Gastroenterology

## 2013-11-30 DIAGNOSIS — R634 Abnormal weight loss: Secondary | ICD-10-CM

## 2013-11-30 DIAGNOSIS — R110 Nausea: Secondary | ICD-10-CM

## 2013-12-05 ENCOUNTER — Ambulatory Visit
Admission: RE | Admit: 2013-12-05 | Discharge: 2013-12-05 | Disposition: A | Discharge status: Home / Self Care | Payer: BC Managed Care – PPO | Source: Ambulatory Visit | Attending: Gastroenterology | Admitting: Gastroenterology

## 2013-12-05 ENCOUNTER — Encounter (INDEPENDENT_AMBULATORY_CARE_PROVIDER_SITE_OTHER): Payer: Self-pay

## 2013-12-05 DIAGNOSIS — R634 Abnormal weight loss: Secondary | ICD-10-CM

## 2013-12-05 DIAGNOSIS — R110 Nausea: Secondary | ICD-10-CM

## 2013-12-12 ENCOUNTER — Other Ambulatory Visit: Payer: Self-pay | Admitting: Obstetrics and Gynecology

## 2013-12-12 DIAGNOSIS — Z1231 Encounter for screening mammogram for malignant neoplasm of breast: Secondary | ICD-10-CM

## 2014-01-04 ENCOUNTER — Ambulatory Visit: Payer: BC Managed Care – PPO

## 2014-01-09 ENCOUNTER — Ambulatory Visit: Payer: BC Managed Care – PPO

## 2014-01-11 ENCOUNTER — Ambulatory Visit: Payer: BC Managed Care – PPO

## 2014-01-16 ENCOUNTER — Ambulatory Visit (INDEPENDENT_AMBULATORY_CARE_PROVIDER_SITE_OTHER): Payer: BC Managed Care – PPO

## 2014-01-16 DIAGNOSIS — Z1231 Encounter for screening mammogram for malignant neoplasm of breast: Secondary | ICD-10-CM

## 2014-06-24 IMAGING — US US ABDOMEN COMPLETE
1 series · 14 of 25 positions shown · non-contrast
Comparison: None.

CLINICAL DATA: Nausea, weight loss

EXAM:
ULTRASOUND ABDOMEN COMPLETE

[Series 1: us abdomen complete · 0.24mm/px · 14 of 74 slices shown]
[im 1/74]
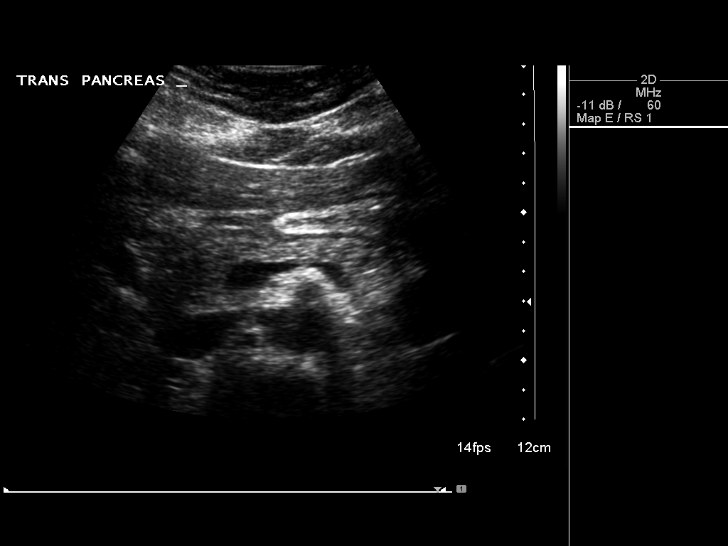
[im 7/74]
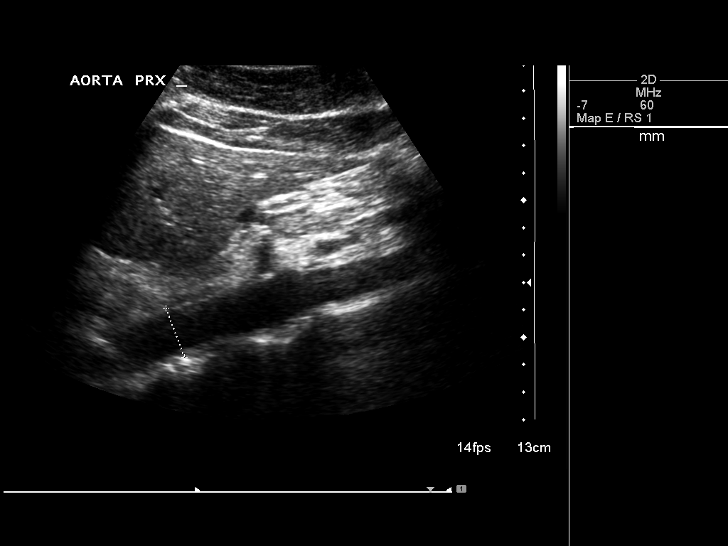
[im 13/74]
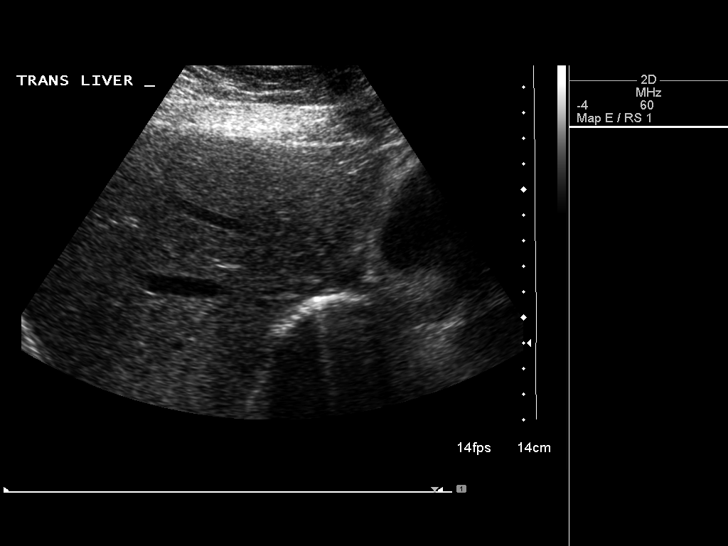
[im 19/74]
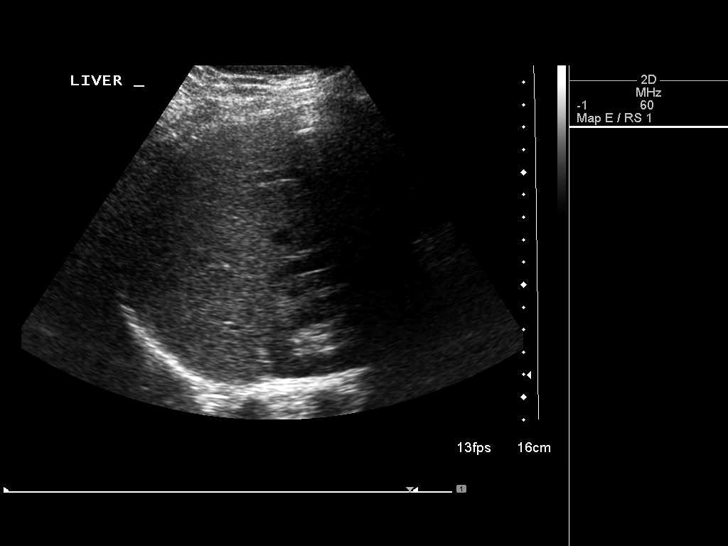
[im 25/74]
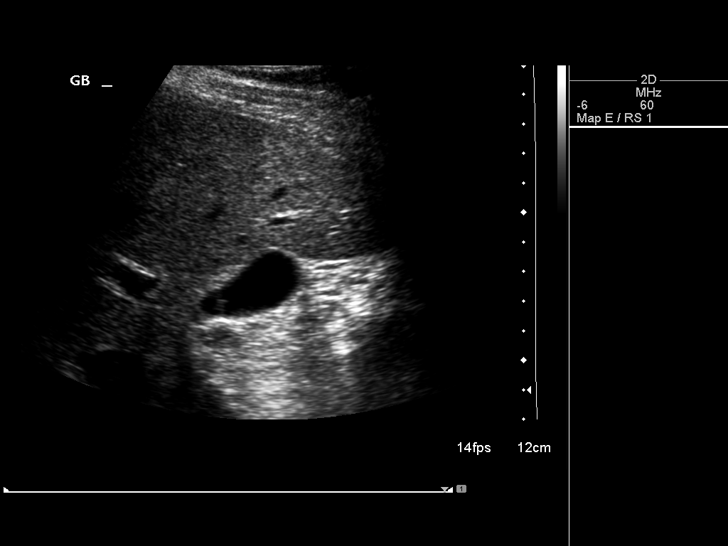
[im 28/74]
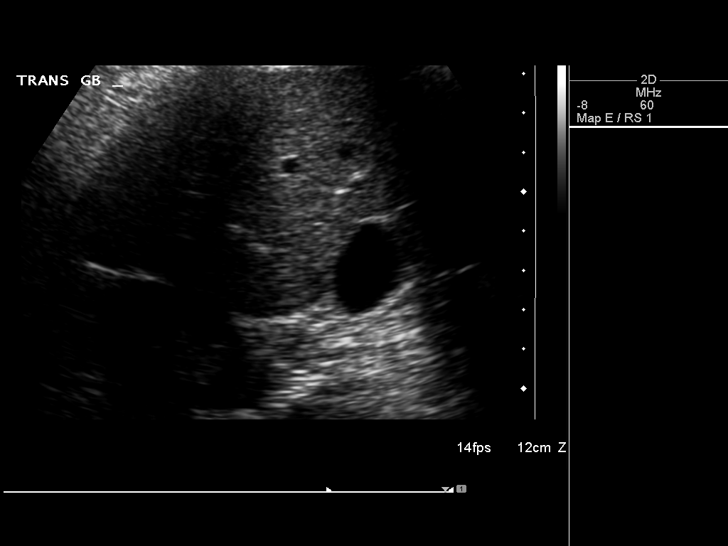
[im 34/74]
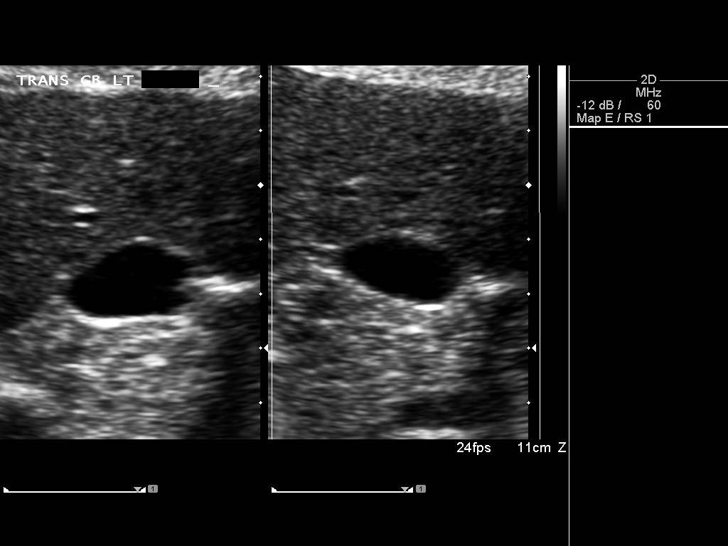
[im 40/74]
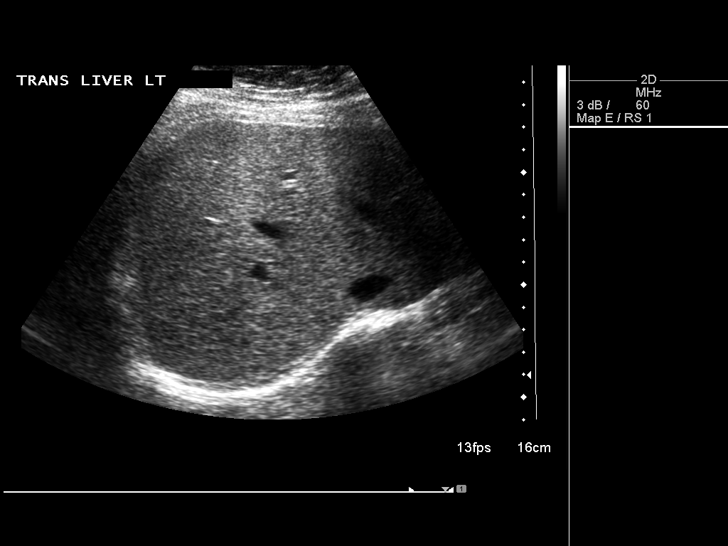
[im 46/74]
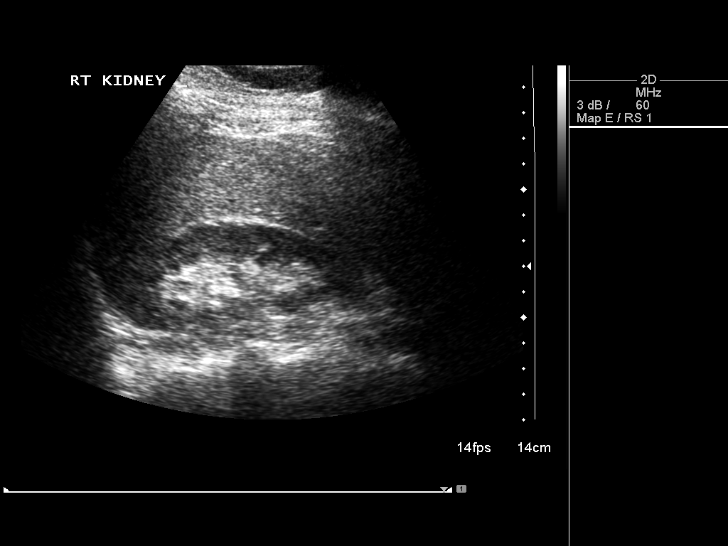
[im 49/74]
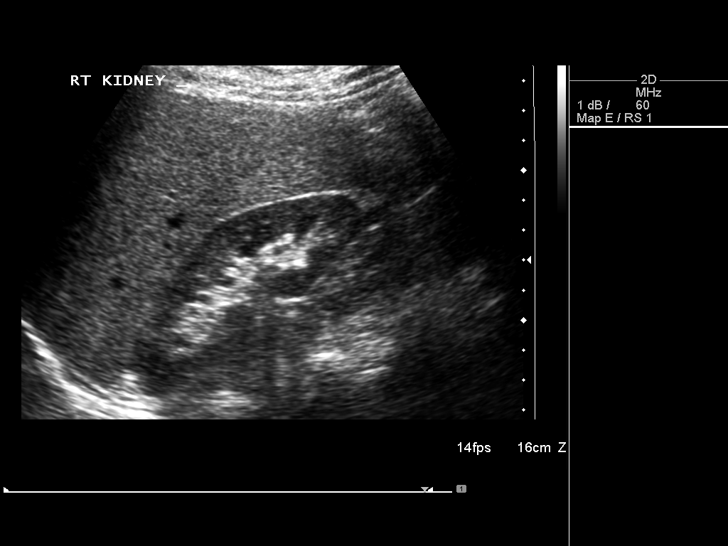
[im 55/74]
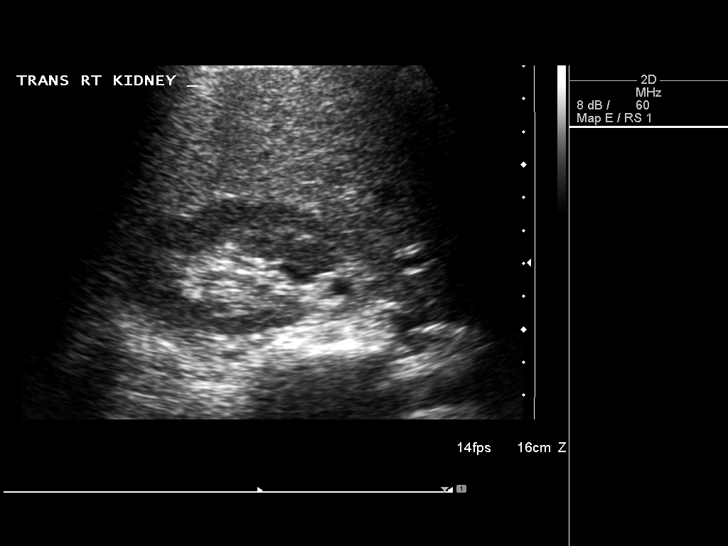
[im 61/74]
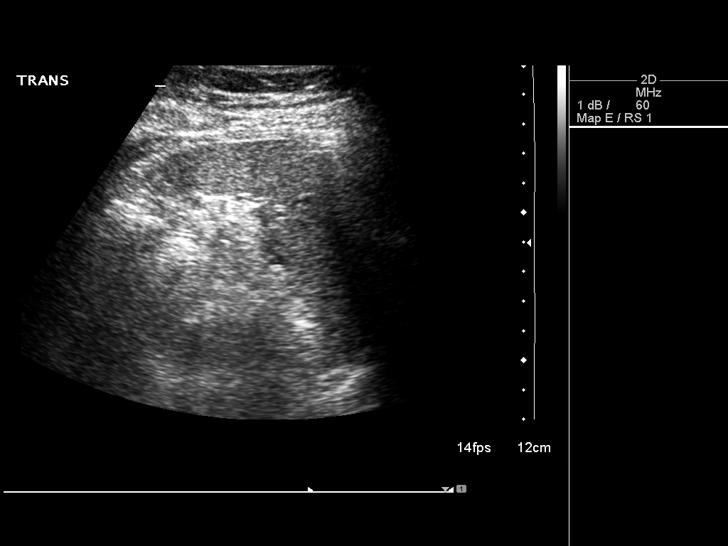
[im 67/74]
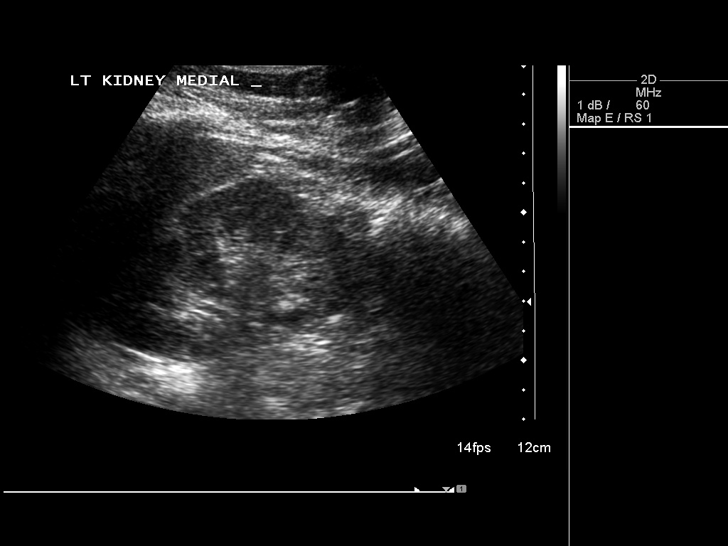
[im 74/74]
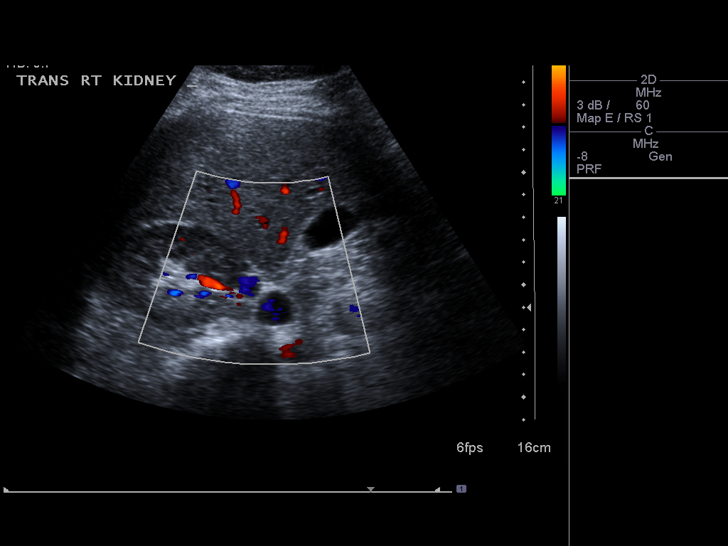

[14 of 25 positions shown; findings below may reference images not displayed]

FINDINGS: Gallbladder:

The gallbladder is visualized with no gallstones are seen. There is
no pain over the gallbladder with compression.

Common bile duct:

Diameter: The common bile duct is normal measuring 2.4 mm in
diameter.

Liver:

The liver has a normal echogenic pattern. No focal abnormality is
seen.

IVC:

No abnormality visualized.

Pancreas:

Visualized portion unremarkable.

Spleen:

The spleen is normal measuring 8.1 cm sagittally.

Right Kidney:

Length: 9.7 cm..  No hydronephrosis is seen.

Left Kidney:

Length: 10.5 cm..  No hydronephrosis is noted.

Abdominal aorta:

The abdominal aorta is normal caliber.

Other findings:

None.
IMPRESSION: Negative abdominal ultrasound.  No gallstones.

## 2015-01-15 ENCOUNTER — Emergency Department (HOSPITAL_COMMUNITY)
Admission: EM | Admit: 2015-01-15 | Discharge: 2015-01-15 | Disposition: A | Discharge status: Home / Self Care | Payer: Self-pay | Attending: Emergency Medicine | Admitting: Emergency Medicine

## 2015-01-15 ENCOUNTER — Emergency Department (HOSPITAL_COMMUNITY): Payer: Self-pay

## 2015-01-15 ENCOUNTER — Encounter (HOSPITAL_COMMUNITY): Payer: Self-pay

## 2015-01-15 DIAGNOSIS — F419 Anxiety disorder, unspecified: Secondary | ICD-10-CM | POA: Insufficient documentation

## 2015-01-15 DIAGNOSIS — Z8742 Personal history of other diseases of the female genital tract: Secondary | ICD-10-CM | POA: Insufficient documentation

## 2015-01-15 DIAGNOSIS — R0602 Shortness of breath: Secondary | ICD-10-CM | POA: Insufficient documentation

## 2015-01-15 DIAGNOSIS — E119 Type 2 diabetes mellitus without complications: Secondary | ICD-10-CM | POA: Insufficient documentation

## 2015-01-15 DIAGNOSIS — Z79899 Other long term (current) drug therapy: Secondary | ICD-10-CM | POA: Insufficient documentation

## 2015-01-15 DIAGNOSIS — R079 Chest pain, unspecified: Secondary | ICD-10-CM | POA: Insufficient documentation

## 2015-01-15 LAB — CBC
HEMATOCRIT: 35.8 % — AB (ref 36.0–46.0)
Hemoglobin: 11.9 g/dL — ABNORMAL LOW (ref 12.0–15.0)
MCH: 29.3 pg (ref 26.0–34.0)
MCHC: 33.2 g/dL (ref 30.0–36.0)
MCV: 88.2 fL (ref 78.0–100.0)
Platelets: 222 10*3/uL (ref 150–400)
RBC: 4.06 MIL/uL (ref 3.87–5.11)
RDW: 12.5 % (ref 11.5–15.5)
WBC: 6.4 10*3/uL (ref 4.0–10.5)

## 2015-01-15 LAB — BASIC METABOLIC PANEL
Anion gap: 9 (ref 5–15)
BUN: 13 mg/dL (ref 6–20)
CALCIUM: 9.1 mg/dL (ref 8.9–10.3)
CO2: 27 mmol/L (ref 22–32)
Chloride: 103 mmol/L (ref 101–111)
Creatinine, Ser: 1.04 mg/dL — ABNORMAL HIGH (ref 0.44–1.00)
Glucose, Bld: 103 mg/dL — ABNORMAL HIGH (ref 65–99)
Potassium: 3.8 mmol/L (ref 3.5–5.1)
SODIUM: 139 mmol/L (ref 135–145)

## 2015-01-15 LAB — BRAIN NATRIURETIC PEPTIDE: B NATRIURETIC PEPTIDE 5: 16.7 pg/mL (ref 0.0–100.0)

## 2015-01-15 LAB — I-STAT TROPONIN, ED: Troponin i, poc: 0.01 ng/mL (ref 0.00–0.08)

## 2015-01-15 NOTE — Discharge Instructions (Signed)

## 2015-01-15 NOTE — ED Notes (Signed)
Pt has been having chest tightness for over 2 weeks now. Was exercising and started seeing "black" and the tightness would come and go away. Denies being stressed. Left arm has been bothering her and having lower back aching. Has also reported SOB and fatigue.

## 2015-01-15 NOTE — ED Provider Notes (Addendum)
CSN: [id number]     Arrival date & time 01/15/15  1308 History   First MD Initiated Contact with Patient 01/15/15 1708     Chief Complaint  Patient presents with  . Chest Pain     (Consider location/radiation/quality/duration/timing/severity/associated sxs/prior Treatment) Patient is a 44 y.o. female presenting with chest pain. The history is provided by the patient.  Chest Pain Pain location:  L chest Associated symptoms: shortness of breath   Associated symptoms: no abdominal pain, no back pain and no numbness    patient has had chest pain on and off for last couple weeks. Worse with exertion. As dull in her mid chest and states he does go to the left arm some. Does come on with exertion but also occasionally come on with rest. Not associated with eating. States she started a exercise program because she is going to [place] and she thinks she's been more fatigued than she should've been off his been doing. No fevers. No swelling or legs. She does not smoke. She has family history of early cardiac disease at 39.  Past Medical History  Diagnosis Date  . Atypical lobular hyperplasia of breast   . Anxiety   . Diabetes mellitus 2010    diet controlled; pt stopped Metformin 2-3 yrs ago  . WUJWJXBJ(478.2)    Past Surgical History  Procedure Laterality Date  . Fracture repair  1995    left arm   Family History  Problem Relation Age of Onset  . Cancer Father     prostate  . Cancer Paternal Aunt     melanoma  . Cancer Maternal Grandmother     ovarian   History  Substance Use Topics  . Smoking status: Never Smoker   . Smokeless tobacco: Never Used  . Alcohol Use: Yes     Comment: occasional   OB History    No data available     Review of Systems  Constitutional: Negative for appetite change.  Respiratory: Positive for shortness of breath.   Cardiovascular: Positive for chest pain.  Gastrointestinal: Negative for abdominal pain.  Genitourinary: Negative for dysuria.   Musculoskeletal: Negative for back pain.  Skin: Negative for wound.  Neurological: Negative for numbness.      Allergies  Review of patient's allergies indicates no known allergies.  Home Medications   Prior to Admission medications   Medication Sig Start Date End Date Taking? Authorizing Provider  chlorpheniramine-HYDROcodone (TUSSIONEX PENNKINETIC ER) 10-8 MG/5ML LQCR Take 5 mLs by mouth every 12 (twelve) hours as needed for cough. 10/11/13   Elwin Mocha, MD  Cholecalciferol (VITAMIN D) 2000 UNITS CAPS Take 2,000 Units by mouth daily.     Historical Provider, MD  Coenzyme Q10 (COQ-10) 400 MG CAPS Take 400 mg by mouth daily.     Historical Provider, MD  escitalopram (LEXAPRO) 20 MG tablet Take 20 mg by mouth daily.    Historical Provider, MD  escitalopram (LEXAPRO) 20 MG tablet Take 20 mg by mouth daily.    Historical Provider, MD  Omega-3 Fatty Acids (FISH OIL PO) Take 1 capsule by mouth daily.    Historical Provider, MD  ondansetron (ZOFRAN ODT) 4 MG disintegrating tablet  ODT q4 hours prn nausea/vomit 10/11/13   Elwin Mocha, MD  ondansetron (ZOFRAN ODT) 4 MG disintegrating tablet  ODT q4 hours prn nausea/vomit 10/11/13   Elwin Mocha, MD  Probiotic Product (PROBIOTIC DAILY PO) Take 1 tablet by mouth daily.    Historical Provider, MD   BP 109/68 mmHg  Pulse 59  Temp(Src) 97.6 F (36.4 C) (Oral)  Resp 17  Ht 5\' 2"  (1.575 m)  Wt 146 lb (66.225 kg)  BMI 26.70 kg/m2  SpO2 97%  LMP 02/02/2012 Physical Exam  Constitutional: She is oriented to person, place, and time. She appears well-developed and well-nourished.  HENT:  Head: Normocephalic and atraumatic.  Neck: Neck supple. No JVD present.  Cardiovascular: Normal rate, regular rhythm and normal heart sounds.   No murmur heard. Pulmonary/Chest: Effort normal and breath sounds normal. No respiratory distress. She has no wheezes. She has no rales. She exhibits no tenderness.  Abdominal: Soft. Bowel sounds are normal. She  exhibits no distension. There is no tenderness.  Musculoskeletal: Normal range of motion.  Neurological: She is alert and oriented to person, place, and time. No cranial nerve deficit.  Skin: Skin is warm and dry.  Psychiatric: She has a normal mood and affect. Her speech is normal.  Nursing note and vitals reviewed.   ED Course  Procedures (including critical care time) Labs Review Labs Reviewed  CBC - Abnormal; Notable for the following:    Hemoglobin 11.9 (*)    HCT 35.8 (*)    All other components within normal limits  BASIC METABOLIC PANEL - Abnormal; Notable for the following:    Glucose, Bld 103 (*)    Creatinine, Ser 1.04 (*)    All other components within normal limits  BRAIN NATRIURETIC PEPTIDE  I-STAT TROPOININ, ED    Imaging Review Dg Chest 2 View  01/15/2015   CLINICAL DATA:  Chest tightness for 2 weeks  EXAM: CHEST  2 VIEW  COMPARISON:  None.  FINDINGS: The heart size and mediastinal contours are within normal limits. Both lungs are clear. The visualized skeletal structures are unremarkable.  IMPRESSION: No active cardiopulmonary disease.   Electronically Signed   By: Alcide CleverMark  Lukens M.D.   On: 01/15/2015 14:24     EKG Interpretation   Date/Time:  Tuesday Jan 15 2015 13:15:30 EDT Ventricular Rate:  67 PR Interval:  164 QRS Duration: 76 QT Interval:  376 QTC Calculation: 397 R Axis:   55 Text Interpretation:  Normal sinus rhythm Septal infarct , age  undetermined Abnormal ECG No significant change since last tracing  Reconfirmed by Crissie Aloi  MD, Harrold DonathNATHAN [phone]) on 01/17/2015 7:13:33 AM      MDM   Final diagnoses:  Chest pain, unspecified chest pain type    Patient with chest pain. Has some occasional exertional component to it. EKG reassuring and enzymes negative 2. Discuss with cardiology who will see the patient follow-up more expeditiously and patient will avoid exertion until follow-up.    Benjiman CoreNathan Alexis Mizuno, MD 01/17/15 [id number]  Benjiman CoreNathan Anajulia Leyendecker,  MD 01/28/15 [phone]

## 2015-01-15 NOTE — ED Notes (Signed)
Pt reports central chest tightness with weakness, SOB, back pain and palpitations that have been going on aprox. 3 weeks.  Pt reports she has been unusually tired and just feels like her heart is fluttering at times.  Pt reports that when these episodes hit she becomes very dizzy and feels as if she is going to faint.

## 2016-05-13 DIAGNOSIS — R1030 Lower abdominal pain, unspecified: Secondary | ICD-10-CM | POA: Diagnosis not present

## 2016-05-13 DIAGNOSIS — R110 Nausea: Secondary | ICD-10-CM | POA: Diagnosis not present

## 2016-06-22 DIAGNOSIS — M62838 Other muscle spasm: Secondary | ICD-10-CM | POA: Diagnosis not present

## 2016-06-22 DIAGNOSIS — M9903 Segmental and somatic dysfunction of lumbar region: Secondary | ICD-10-CM | POA: Diagnosis not present

## 2016-06-25 DIAGNOSIS — M9903 Segmental and somatic dysfunction of lumbar region: Secondary | ICD-10-CM | POA: Diagnosis not present

## 2016-06-25 DIAGNOSIS — M62838 Other muscle spasm: Secondary | ICD-10-CM | POA: Diagnosis not present

## 2016-08-04 DIAGNOSIS — M9903 Segmental and somatic dysfunction of lumbar region: Secondary | ICD-10-CM | POA: Diagnosis not present

## 2016-08-04 DIAGNOSIS — M62838 Other muscle spasm: Secondary | ICD-10-CM | POA: Diagnosis not present

## 2016-09-15 DIAGNOSIS — F419 Anxiety disorder, unspecified: Secondary | ICD-10-CM | POA: Diagnosis not present

## 2017-02-15 DIAGNOSIS — D2262 Melanocytic nevi of left upper limb, including shoulder: Secondary | ICD-10-CM | POA: Diagnosis not present

## 2017-02-15 DIAGNOSIS — D2261 Melanocytic nevi of right upper limb, including shoulder: Secondary | ICD-10-CM | POA: Diagnosis not present

## 2017-02-15 DIAGNOSIS — D225 Melanocytic nevi of trunk: Secondary | ICD-10-CM | POA: Diagnosis not present

## 2017-02-15 DIAGNOSIS — D2271 Melanocytic nevi of right lower limb, including hip: Secondary | ICD-10-CM | POA: Diagnosis not present

## 2017-06-23 DIAGNOSIS — Z1231 Encounter for screening mammogram for malignant neoplasm of breast: Secondary | ICD-10-CM | POA: Diagnosis not present

## 2017-06-23 DIAGNOSIS — Z01419 Encounter for gynecological examination (general) (routine) without abnormal findings: Secondary | ICD-10-CM | POA: Diagnosis not present

## 2017-06-23 DIAGNOSIS — Z6825 Body mass index (BMI) 25.0-25.9, adult: Secondary | ICD-10-CM | POA: Diagnosis not present

## 2017-06-23 DIAGNOSIS — N816 Rectocele: Secondary | ICD-10-CM | POA: Diagnosis not present

## 2017-10-07 ENCOUNTER — Encounter (HOSPITAL_BASED_OUTPATIENT_CLINIC_OR_DEPARTMENT_OTHER): Payer: Self-pay

## 2017-10-07 ENCOUNTER — Ambulatory Visit (HOSPITAL_BASED_OUTPATIENT_CLINIC_OR_DEPARTMENT_OTHER): Admit: 2017-10-07 | Payer: Self-pay | Admitting: Obstetrics and Gynecology

## 2017-10-07 SURGERY — COLPORRHAPHY, POSTERIOR, FOR RECTOCELE REPAIR
Anesthesia: Choice

## 2018-01-17 DIAGNOSIS — R400 Somnolence: Secondary | ICD-10-CM | POA: Diagnosis not present

## 2018-01-17 DIAGNOSIS — E559 Vitamin D deficiency, unspecified: Secondary | ICD-10-CM | POA: Diagnosis not present

## 2018-01-17 DIAGNOSIS — R0683 Snoring: Secondary | ICD-10-CM | POA: Diagnosis not present

## 2018-01-17 DIAGNOSIS — R5383 Other fatigue: Secondary | ICD-10-CM | POA: Diagnosis not present

## 2018-03-09 DIAGNOSIS — G4763 Sleep related bruxism: Secondary | ICD-10-CM | POA: Diagnosis not present

## 2018-03-09 DIAGNOSIS — G478 Other sleep disorders: Secondary | ICD-10-CM | POA: Diagnosis not present

## 2018-03-09 DIAGNOSIS — G4719 Other hypersomnia: Secondary | ICD-10-CM | POA: Diagnosis not present

## 2018-03-09 DIAGNOSIS — R0683 Snoring: Secondary | ICD-10-CM | POA: Diagnosis not present

## 2018-03-17 DIAGNOSIS — D229 Melanocytic nevi, unspecified: Secondary | ICD-10-CM | POA: Diagnosis not present

## 2018-03-17 DIAGNOSIS — D225 Melanocytic nevi of trunk: Secondary | ICD-10-CM | POA: Diagnosis not present

## 2018-03-17 DIAGNOSIS — C44329 Squamous cell carcinoma of skin of other parts of face: Secondary | ICD-10-CM | POA: Diagnosis not present

## 2018-03-17 DIAGNOSIS — D1801 Hemangioma of skin and subcutaneous tissue: Secondary | ICD-10-CM | POA: Diagnosis not present

## 2018-03-17 DIAGNOSIS — L814 Other melanin hyperpigmentation: Secondary | ICD-10-CM | POA: Diagnosis not present

## 2018-03-17 DIAGNOSIS — D485 Neoplasm of uncertain behavior of skin: Secondary | ICD-10-CM | POA: Diagnosis not present

## 2018-05-24 DIAGNOSIS — N951 Menopausal and female climacteric states: Secondary | ICD-10-CM | POA: Diagnosis not present

## 2018-05-24 DIAGNOSIS — E039 Hypothyroidism, unspecified: Secondary | ICD-10-CM | POA: Diagnosis not present

## 2018-05-24 DIAGNOSIS — Z79890 Hormone replacement therapy (postmenopausal): Secondary | ICD-10-CM | POA: Diagnosis not present

## 2018-05-24 DIAGNOSIS — M810 Age-related osteoporosis without current pathological fracture: Secondary | ICD-10-CM | POA: Diagnosis not present

## 2018-07-11 DIAGNOSIS — K581 Irritable bowel syndrome with constipation: Secondary | ICD-10-CM | POA: Diagnosis not present

## 2018-07-11 DIAGNOSIS — E559 Vitamin D deficiency, unspecified: Secondary | ICD-10-CM | POA: Diagnosis not present

## 2018-07-11 DIAGNOSIS — D899 Disorder involving the immune mechanism, unspecified: Secondary | ICD-10-CM | POA: Diagnosis not present

## 2018-07-11 DIAGNOSIS — M810 Age-related osteoporosis without current pathological fracture: Secondary | ICD-10-CM | POA: Diagnosis not present

## 2018-07-11 DIAGNOSIS — E039 Hypothyroidism, unspecified: Secondary | ICD-10-CM | POA: Diagnosis not present

## 2018-07-11 DIAGNOSIS — Z79890 Hormone replacement therapy (postmenopausal): Secondary | ICD-10-CM | POA: Diagnosis not present

## 2018-07-11 DIAGNOSIS — N951 Menopausal and female climacteric states: Secondary | ICD-10-CM | POA: Diagnosis not present

## 2018-10-27 DIAGNOSIS — N951 Menopausal and female climacteric states: Secondary | ICD-10-CM | POA: Diagnosis not present

## 2018-10-27 DIAGNOSIS — M810 Age-related osteoporosis without current pathological fracture: Secondary | ICD-10-CM | POA: Diagnosis not present

## 2018-10-27 DIAGNOSIS — Z79890 Hormone replacement therapy (postmenopausal): Secondary | ICD-10-CM | POA: Diagnosis not present

## 2019-01-11 DIAGNOSIS — G4733 Obstructive sleep apnea (adult) (pediatric): Secondary | ICD-10-CM | POA: Diagnosis not present

## 2019-02-20 DIAGNOSIS — M810 Age-related osteoporosis without current pathological fracture: Secondary | ICD-10-CM | POA: Diagnosis not present

## 2019-02-20 DIAGNOSIS — Z79890 Hormone replacement therapy (postmenopausal): Secondary | ICD-10-CM | POA: Diagnosis not present

## 2019-02-20 DIAGNOSIS — N951 Menopausal and female climacteric states: Secondary | ICD-10-CM | POA: Diagnosis not present

## 2019-02-20 DIAGNOSIS — R5383 Other fatigue: Secondary | ICD-10-CM | POA: Diagnosis not present

## 2019-02-23 DIAGNOSIS — G4733 Obstructive sleep apnea (adult) (pediatric): Secondary | ICD-10-CM | POA: Diagnosis not present

## 2019-03-09 DIAGNOSIS — G4733 Obstructive sleep apnea (adult) (pediatric): Secondary | ICD-10-CM | POA: Diagnosis not present

## 2019-04-09 DIAGNOSIS — G4733 Obstructive sleep apnea (adult) (pediatric): Secondary | ICD-10-CM | POA: Diagnosis not present

## 2019-04-13 DIAGNOSIS — E039 Hypothyroidism, unspecified: Secondary | ICD-10-CM | POA: Diagnosis not present

## 2019-04-13 DIAGNOSIS — Z1231 Encounter for screening mammogram for malignant neoplasm of breast: Secondary | ICD-10-CM | POA: Diagnosis not present

## 2019-04-13 DIAGNOSIS — F329 Major depressive disorder, single episode, unspecified: Secondary | ICD-10-CM | POA: Diagnosis not present

## 2019-04-13 DIAGNOSIS — Z01419 Encounter for gynecological examination (general) (routine) without abnormal findings: Secondary | ICD-10-CM | POA: Diagnosis not present

## 2019-04-13 DIAGNOSIS — Z6826 Body mass index (BMI) 26.0-26.9, adult: Secondary | ICD-10-CM | POA: Diagnosis not present

## 2019-05-02 DIAGNOSIS — E039 Hypothyroidism, unspecified: Secondary | ICD-10-CM | POA: Diagnosis not present

## 2019-05-02 DIAGNOSIS — E049 Nontoxic goiter, unspecified: Secondary | ICD-10-CM | POA: Diagnosis not present

## 2019-05-08 ENCOUNTER — Other Ambulatory Visit: Payer: Self-pay | Admitting: Endocrinology

## 2019-05-08 DIAGNOSIS — E049 Nontoxic goiter, unspecified: Secondary | ICD-10-CM

## 2019-05-10 DIAGNOSIS — G4733 Obstructive sleep apnea (adult) (pediatric): Secondary | ICD-10-CM | POA: Diagnosis not present

## 2019-05-12 ENCOUNTER — Ambulatory Visit
Admission: RE | Admit: 2019-05-12 | Discharge: 2019-05-12 | Disposition: A | Discharge status: Home / Self Care | Payer: BC Managed Care – PPO | Source: Ambulatory Visit | Attending: Endocrinology | Admitting: Endocrinology

## 2019-05-12 DIAGNOSIS — E042 Nontoxic multinodular goiter: Secondary | ICD-10-CM | POA: Diagnosis not present

## 2019-05-12 DIAGNOSIS — E049 Nontoxic goiter, unspecified: Secondary | ICD-10-CM

## 2019-06-09 DIAGNOSIS — G4733 Obstructive sleep apnea (adult) (pediatric): Secondary | ICD-10-CM | POA: Diagnosis not present

## 2019-06-14 DIAGNOSIS — E60 Dietary zinc deficiency: Secondary | ICD-10-CM | POA: Diagnosis not present

## 2019-06-14 DIAGNOSIS — D8949 Other mast cell activation disorder: Secondary | ICD-10-CM | POA: Diagnosis not present

## 2019-06-14 DIAGNOSIS — M810 Age-related osteoporosis without current pathological fracture: Secondary | ICD-10-CM | POA: Diagnosis not present

## 2019-07-10 DIAGNOSIS — G4733 Obstructive sleep apnea (adult) (pediatric): Secondary | ICD-10-CM | POA: Diagnosis not present

## 2019-07-24 DIAGNOSIS — D229 Melanocytic nevi, unspecified: Secondary | ICD-10-CM | POA: Diagnosis not present

## 2019-07-24 DIAGNOSIS — D1801 Hemangioma of skin and subcutaneous tissue: Secondary | ICD-10-CM | POA: Diagnosis not present

## 2019-07-24 DIAGNOSIS — L821 Other seborrheic keratosis: Secondary | ICD-10-CM | POA: Diagnosis not present

## 2019-07-24 DIAGNOSIS — L816 Other disorders of diminished melanin formation: Secondary | ICD-10-CM | POA: Diagnosis not present

## 2019-08-09 DIAGNOSIS — E039 Hypothyroidism, unspecified: Secondary | ICD-10-CM | POA: Diagnosis not present

## 2019-08-09 DIAGNOSIS — N951 Menopausal and female climacteric states: Secondary | ICD-10-CM | POA: Diagnosis not present

## 2019-08-09 DIAGNOSIS — Z79890 Hormone replacement therapy (postmenopausal): Secondary | ICD-10-CM | POA: Diagnosis not present

## 2019-08-09 DIAGNOSIS — M810 Age-related osteoporosis without current pathological fracture: Secondary | ICD-10-CM | POA: Diagnosis not present

## 2019-08-09 DIAGNOSIS — G4733 Obstructive sleep apnea (adult) (pediatric): Secondary | ICD-10-CM | POA: Diagnosis not present

## 2019-09-09 DIAGNOSIS — G4733 Obstructive sleep apnea (adult) (pediatric): Secondary | ICD-10-CM | POA: Diagnosis not present

## 2019-09-28 DIAGNOSIS — G4730 Sleep apnea, unspecified: Secondary | ICD-10-CM | POA: Diagnosis not present

## 2019-09-28 DIAGNOSIS — N951 Menopausal and female climacteric states: Secondary | ICD-10-CM | POA: Diagnosis not present

## 2019-09-28 DIAGNOSIS — R5383 Other fatigue: Secondary | ICD-10-CM | POA: Diagnosis not present

## 2019-09-28 DIAGNOSIS — E559 Vitamin D deficiency, unspecified: Secondary | ICD-10-CM | POA: Diagnosis not present

## 2019-09-28 DIAGNOSIS — E039 Hypothyroidism, unspecified: Secondary | ICD-10-CM | POA: Diagnosis not present

## 2019-09-28 DIAGNOSIS — E538 Deficiency of other specified B group vitamins: Secondary | ICD-10-CM | POA: Diagnosis not present

## 2019-10-10 DIAGNOSIS — G4733 Obstructive sleep apnea (adult) (pediatric): Secondary | ICD-10-CM | POA: Diagnosis not present

## 2019-10-23 DIAGNOSIS — R140 Abdominal distension (gaseous): Secondary | ICD-10-CM | POA: Diagnosis not present

## 2019-10-23 DIAGNOSIS — F419 Anxiety disorder, unspecified: Secondary | ICD-10-CM | POA: Diagnosis not present

## 2019-10-23 DIAGNOSIS — E039 Hypothyroidism, unspecified: Secondary | ICD-10-CM | POA: Diagnosis not present

## 2019-10-23 DIAGNOSIS — R5383 Other fatigue: Secondary | ICD-10-CM | POA: Diagnosis not present

## 2019-11-07 DIAGNOSIS — G4733 Obstructive sleep apnea (adult) (pediatric): Secondary | ICD-10-CM | POA: Diagnosis not present

## 2019-11-22 DIAGNOSIS — G4733 Obstructive sleep apnea (adult) (pediatric): Secondary | ICD-10-CM | POA: Diagnosis not present

## 2019-11-22 DIAGNOSIS — R499 Unspecified voice and resonance disorder: Secondary | ICD-10-CM | POA: Diagnosis not present

## 2019-11-29 IMAGING — US US THYROID
1 series · 13 of 25 positions shown · non-contrast
Comparison: 02/02/2012

CLINICAL DATA: Prior ultrasound follow-up. Follow-up thyroid
nodules. Neck pressure.

EXAM:
THYROID ULTRASOUND
TECHNIQUE: Ultrasound examination of the thyroid gland and adjacent soft
tissues was performed.

[Series 1: us thyroid · 0.04mm/px · 13 of 56 slices shown]
[im 1/56]
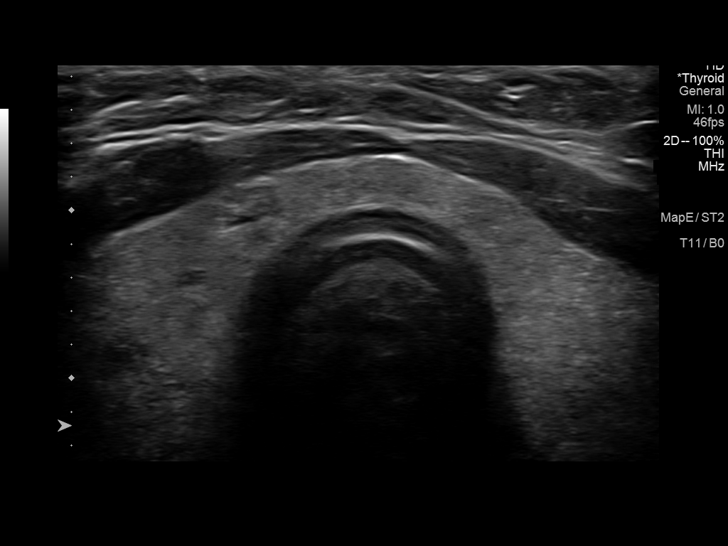
[im 5/56]
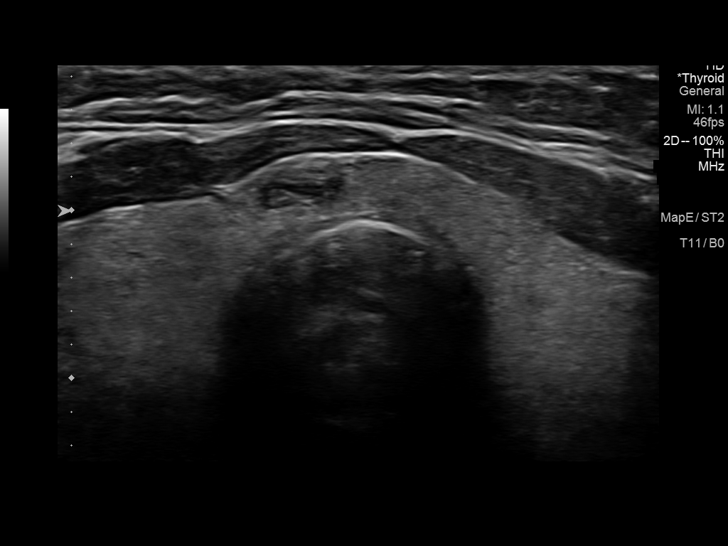
[im 10/56]
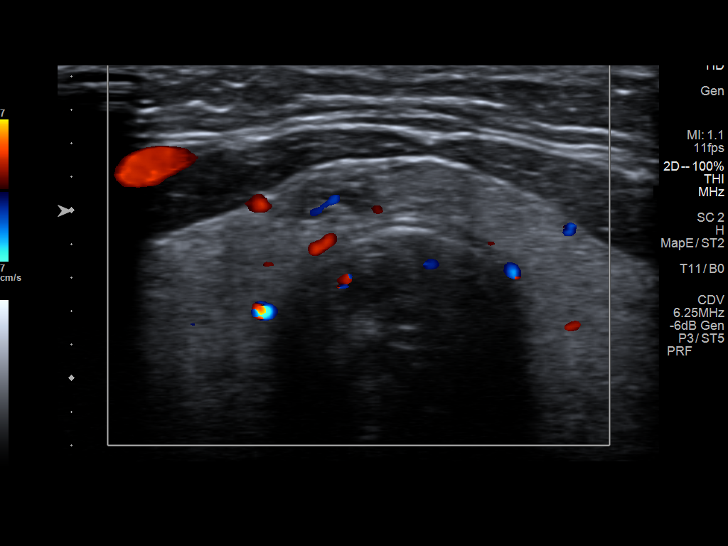
[im 14/56]
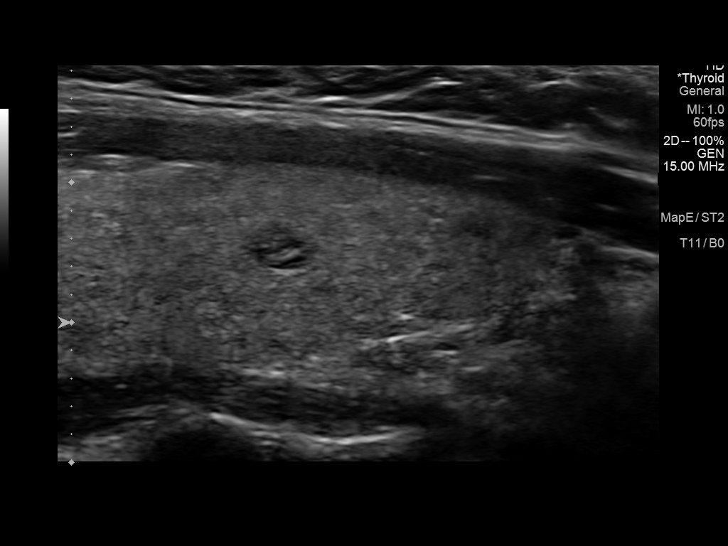
[im 19/56]
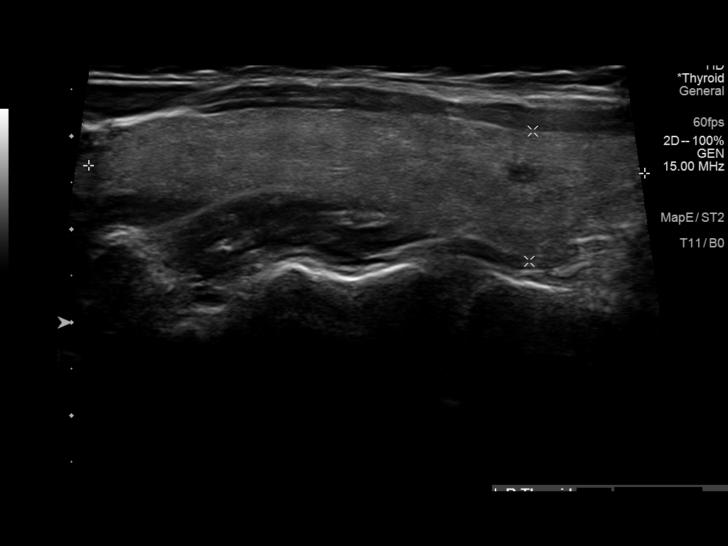
[im 23/56]
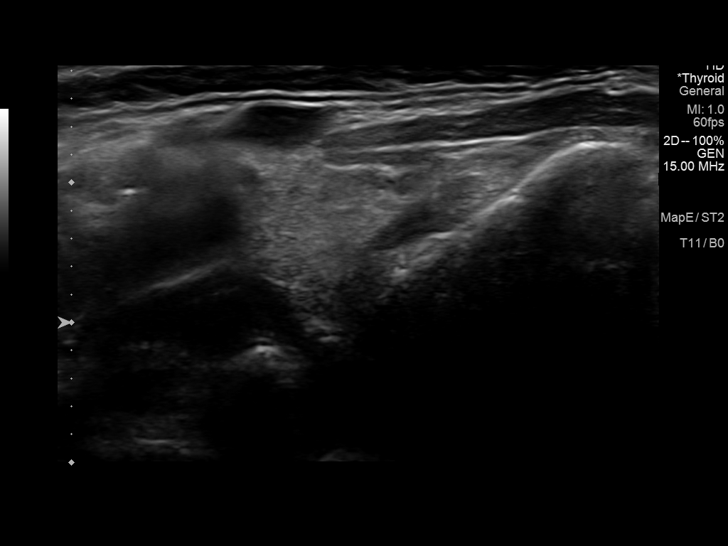
[im 28/56]
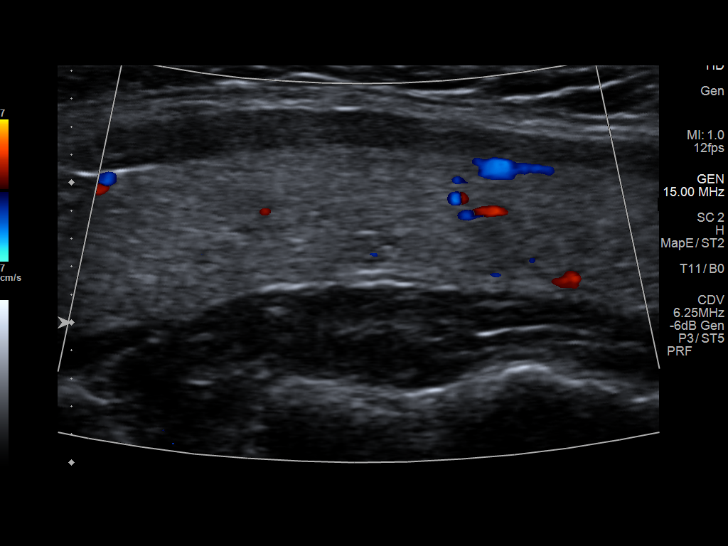
[im 33/56]
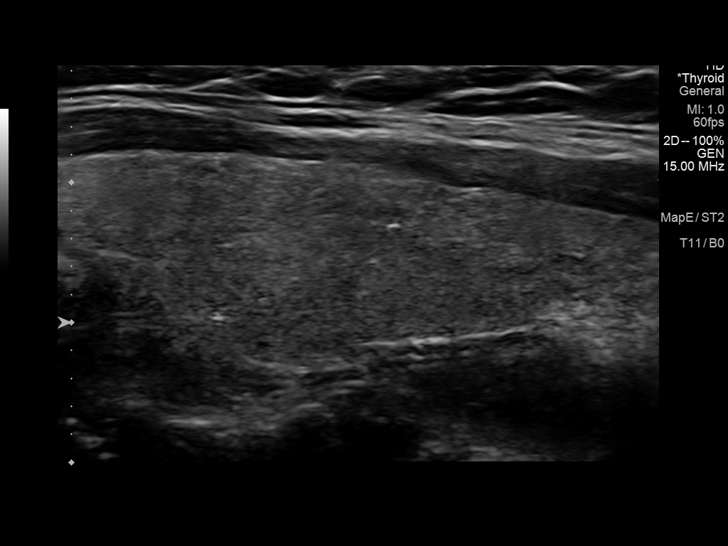
[im 37/56]
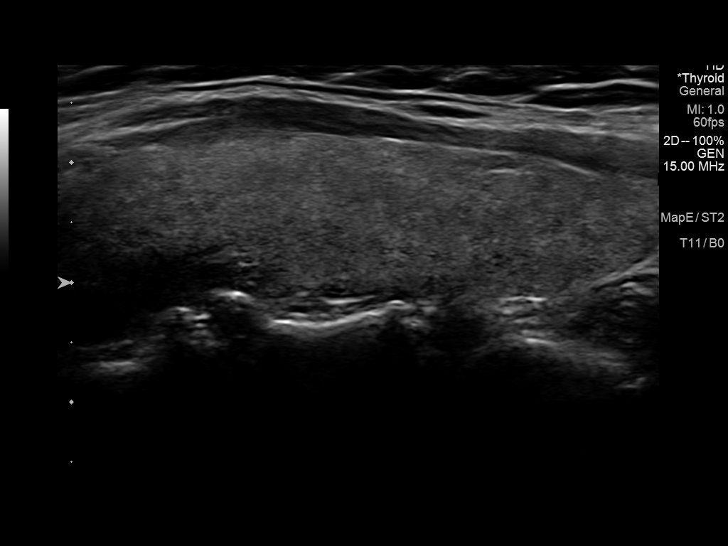
[im 42/56]
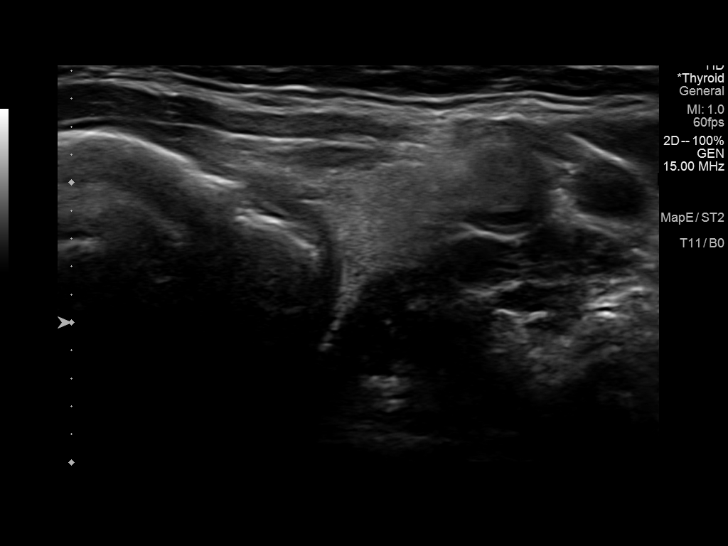
[im 46/56]
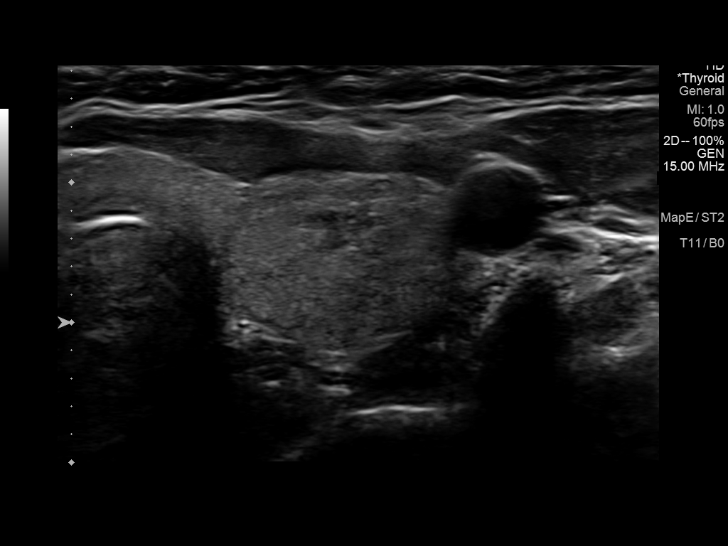
[im 51/56]
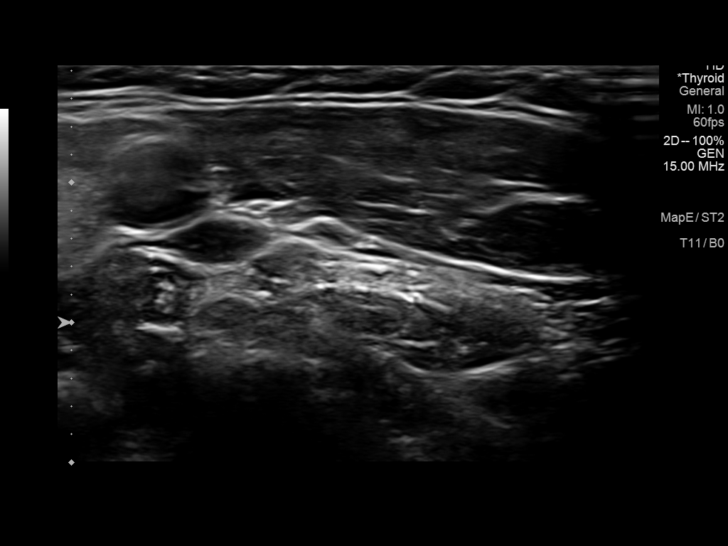
[im 56/56]
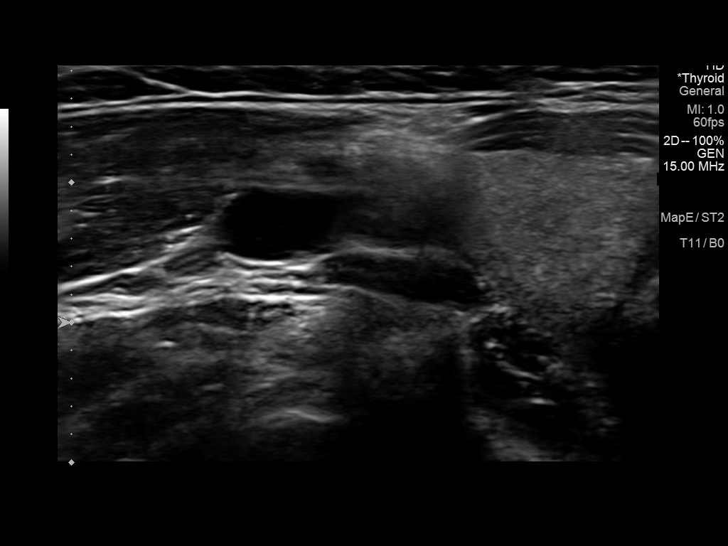

[13 of 25 positions shown; findings below may reference images not displayed]

FINDINGS: Parenchymal Echotexture: Mildly heterogenous

Isthmus: Normal in size measuring 0.3 cm in diameter, unchanged

Right lobe: Borderline enlarged measures 6.1 x 1.4 x 2.0 cm,
unchanged, previously, 6.1 x 1.5 x 1.9 cm.

Left lobe: Borderline enlarged measures 6.0 x 1.6 x 1.9 cm,
unchanged from previously, 6.0 x 1.7 x 1.6 cm.

_________________________________________________________

Estimated total number of nodules >/= 1 cm: 0

Number of spongiform nodules >/=  2 cm not described below (TR1): 0

Number of mixed cystic and solid nodules >/= 1.5 cm not described
below (TR2): 0

_________________________________________________________

There is an approximately 0.7 x 0.5 x 0.2 cm partially solid,
partially cystic nodule within the right-sided the thyroid isthmus
(labeled 1), which is grossly unchanged compared to the [DATE]
examination, previously, 0.8 x 0.5 x 0.4 cm, and again does not meet
imaging criteria to recommend percutaneous sampling or continued
dedicated follow-up.

_________________________________________________________

There is a punctate (approximately 0.5 cm) partially solid,
partially cystic nodule within the mid aspect of the right lobe of
the thyroid, which does not meet imaging criteria to recommend
percutaneous sampling or continued dedicated follow-up.

There is a punctate (approximately 0.2 cm) hypoechoic nodule within
the mid aspect of the left lobe of the thyroid, which does not meet
imaging criteria to recommend percutaneous sampling or continued
dedicated follow-up.

_________________________________________________________

Note is made of a benign appearing non pathologically enlarged right
high cervical lymph node which is not enlarged by size criteria
measuring 0.6 cm in greatest short axis diameter and maintains a
benign fatty hilum (image 54
IMPRESSION: Borderline enlarged and mildly heterogeneous thyroid gland without
worrisome nodule or mass.

The above is in keeping with the ACR TI-RADS recommendations - [HOSPITAL] 8430;[DATE].

## 2019-12-04 DIAGNOSIS — E039 Hypothyroidism, unspecified: Secondary | ICD-10-CM | POA: Diagnosis not present

## 2019-12-04 DIAGNOSIS — N951 Menopausal and female climacteric states: Secondary | ICD-10-CM | POA: Diagnosis not present

## 2019-12-11 DIAGNOSIS — G4733 Obstructive sleep apnea (adult) (pediatric): Secondary | ICD-10-CM | POA: Diagnosis not present

## 2019-12-27 DIAGNOSIS — G4733 Obstructive sleep apnea (adult) (pediatric): Secondary | ICD-10-CM | POA: Diagnosis not present

## 2019-12-27 DIAGNOSIS — K219 Gastro-esophageal reflux disease without esophagitis: Secondary | ICD-10-CM | POA: Diagnosis not present

## 2019-12-27 DIAGNOSIS — J343 Hypertrophy of nasal turbinates: Secondary | ICD-10-CM | POA: Diagnosis not present

## 2019-12-27 DIAGNOSIS — Z9989 Dependence on other enabling machines and devices: Secondary | ICD-10-CM | POA: Diagnosis not present

## 2020-01-11 DIAGNOSIS — G4733 Obstructive sleep apnea (adult) (pediatric): Secondary | ICD-10-CM | POA: Diagnosis not present

## 2020-01-29 DIAGNOSIS — N951 Menopausal and female climacteric states: Secondary | ICD-10-CM | POA: Diagnosis not present

## 2020-01-29 DIAGNOSIS — E039 Hypothyroidism, unspecified: Secondary | ICD-10-CM | POA: Diagnosis not present

## 2020-01-29 DIAGNOSIS — E559 Vitamin D deficiency, unspecified: Secondary | ICD-10-CM | POA: Diagnosis not present

## 2020-01-29 DIAGNOSIS — M810 Age-related osteoporosis without current pathological fracture: Secondary | ICD-10-CM | POA: Diagnosis not present

## 2020-01-29 DIAGNOSIS — Z79890 Hormone replacement therapy (postmenopausal): Secondary | ICD-10-CM | POA: Diagnosis not present

## 2020-03-11 DIAGNOSIS — D225 Melanocytic nevi of trunk: Secondary | ICD-10-CM | POA: Diagnosis not present

## 2020-03-11 DIAGNOSIS — L820 Inflamed seborrheic keratosis: Secondary | ICD-10-CM | POA: Diagnosis not present

## 2020-03-11 DIAGNOSIS — D1801 Hemangioma of skin and subcutaneous tissue: Secondary | ICD-10-CM | POA: Diagnosis not present

## 2020-03-11 DIAGNOSIS — L738 Other specified follicular disorders: Secondary | ICD-10-CM | POA: Diagnosis not present

## 2020-03-11 DIAGNOSIS — L570 Actinic keratosis: Secondary | ICD-10-CM | POA: Diagnosis not present

## 2020-03-25 DIAGNOSIS — N951 Menopausal and female climacteric states: Secondary | ICD-10-CM | POA: Diagnosis not present

## 2020-03-25 DIAGNOSIS — E039 Hypothyroidism, unspecified: Secondary | ICD-10-CM | POA: Diagnosis not present

## 2020-03-25 DIAGNOSIS — Z79890 Hormone replacement therapy (postmenopausal): Secondary | ICD-10-CM | POA: Diagnosis not present

## 2020-03-25 DIAGNOSIS — M810 Age-related osteoporosis without current pathological fracture: Secondary | ICD-10-CM | POA: Diagnosis not present

## 2020-04-12 DIAGNOSIS — N951 Menopausal and female climacteric states: Secondary | ICD-10-CM | POA: Diagnosis not present

## 2020-04-12 DIAGNOSIS — Z79890 Hormone replacement therapy (postmenopausal): Secondary | ICD-10-CM | POA: Diagnosis not present

## 2020-04-12 DIAGNOSIS — M810 Age-related osteoporosis without current pathological fracture: Secondary | ICD-10-CM | POA: Diagnosis not present

## 2020-05-03 DIAGNOSIS — R739 Hyperglycemia, unspecified: Secondary | ICD-10-CM | POA: Diagnosis not present

## 2020-05-03 DIAGNOSIS — E559 Vitamin D deficiency, unspecified: Secondary | ICD-10-CM | POA: Diagnosis not present

## 2020-05-03 DIAGNOSIS — N951 Menopausal and female climacteric states: Secondary | ICD-10-CM | POA: Diagnosis not present

## 2020-05-03 DIAGNOSIS — E7211 Homocystinuria: Secondary | ICD-10-CM | POA: Diagnosis not present

## 2020-05-03 DIAGNOSIS — E782 Mixed hyperlipidemia: Secondary | ICD-10-CM | POA: Diagnosis not present

## 2020-05-03 DIAGNOSIS — R0600 Dyspnea, unspecified: Secondary | ICD-10-CM | POA: Diagnosis not present

## 2020-05-03 DIAGNOSIS — D8949 Other mast cell activation disorder: Secondary | ICD-10-CM | POA: Diagnosis not present

## 2020-05-17 DIAGNOSIS — G4733 Obstructive sleep apnea (adult) (pediatric): Secondary | ICD-10-CM | POA: Diagnosis not present

## 2020-07-03 DIAGNOSIS — Z13820 Encounter for screening for osteoporosis: Secondary | ICD-10-CM | POA: Diagnosis not present

## 2020-07-03 DIAGNOSIS — Z1231 Encounter for screening mammogram for malignant neoplasm of breast: Secondary | ICD-10-CM | POA: Diagnosis not present

## 2020-07-03 DIAGNOSIS — Z6826 Body mass index (BMI) 26.0-26.9, adult: Secondary | ICD-10-CM | POA: Diagnosis not present

## 2020-07-03 DIAGNOSIS — Z01419 Encounter for gynecological examination (general) (routine) without abnormal findings: Secondary | ICD-10-CM | POA: Diagnosis not present

## 2020-07-15 DIAGNOSIS — U071 COVID-19: Secondary | ICD-10-CM | POA: Diagnosis not present

## 2020-07-15 DIAGNOSIS — Z23 Encounter for immunization: Secondary | ICD-10-CM | POA: Diagnosis not present

## 2020-07-22 DIAGNOSIS — M810 Age-related osteoporosis without current pathological fracture: Secondary | ICD-10-CM | POA: Diagnosis not present

## 2020-07-22 DIAGNOSIS — Z79890 Hormone replacement therapy (postmenopausal): Secondary | ICD-10-CM | POA: Diagnosis not present

## 2020-07-22 DIAGNOSIS — N951 Menopausal and female climacteric states: Secondary | ICD-10-CM | POA: Diagnosis not present

## 2021-11-09 ENCOUNTER — Other Ambulatory Visit: Payer: Self-pay

## 2021-11-09 ENCOUNTER — Encounter (HOSPITAL_BASED_OUTPATIENT_CLINIC_OR_DEPARTMENT_OTHER): Payer: Self-pay

## 2021-11-09 ENCOUNTER — Emergency Department (HOSPITAL_BASED_OUTPATIENT_CLINIC_OR_DEPARTMENT_OTHER)
Admission: EM | Admit: 2021-11-09 | Discharge: 2021-11-09 | Disposition: A | Discharge status: Home / Self Care | Payer: BC Managed Care – PPO | Attending: Emergency Medicine | Admitting: Emergency Medicine

## 2021-11-09 ENCOUNTER — Emergency Department (HOSPITAL_BASED_OUTPATIENT_CLINIC_OR_DEPARTMENT_OTHER): Payer: BC Managed Care – PPO

## 2021-11-09 DIAGNOSIS — Z8616 Personal history of COVID-19: Secondary | ICD-10-CM | POA: Diagnosis not present

## 2021-11-09 DIAGNOSIS — R000 Tachycardia, unspecified: Secondary | ICD-10-CM | POA: Diagnosis not present

## 2021-11-09 DIAGNOSIS — R109 Unspecified abdominal pain: Secondary | ICD-10-CM | POA: Diagnosis present

## 2021-11-09 DIAGNOSIS — N3000 Acute cystitis without hematuria: Secondary | ICD-10-CM | POA: Diagnosis not present

## 2021-11-09 DIAGNOSIS — Z20822 Contact with and (suspected) exposure to covid-19: Secondary | ICD-10-CM | POA: Diagnosis not present

## 2021-11-09 DIAGNOSIS — D72829 Elevated white blood cell count, unspecified: Secondary | ICD-10-CM | POA: Insufficient documentation

## 2021-11-09 LAB — URINALYSIS, MICROSCOPIC (REFLEX)

## 2021-11-09 LAB — RESP PANEL BY RT-PCR (FLU A&B, COVID) ARPGX2
Influenza A by PCR: NEGATIVE
Influenza B by PCR: NEGATIVE
SARS Coronavirus 2 by RT PCR: NEGATIVE

## 2021-11-09 LAB — CBC WITH DIFFERENTIAL/PLATELET
Abs Immature Granulocytes: 0.05 10*3/uL (ref 0.00–0.07)
Basophils Absolute: 0.1 10*3/uL (ref 0.0–0.1)
Basophils Relative: 0 %
Eosinophils Absolute: 0.1 10*3/uL (ref 0.0–0.5)
Eosinophils Relative: 0 %
HCT: 36.8 % (ref 36.0–46.0)
Hemoglobin: 12.5 g/dL (ref 12.0–15.0)
Immature Granulocytes: 0 %
Lymphocytes Relative: 6 %
Lymphs Abs: 0.7 10*3/uL (ref 0.7–4.0)
MCH: 30.6 pg (ref 26.0–34.0)
MCHC: 34 g/dL (ref 30.0–36.0)
MCV: 90 fL (ref 80.0–100.0)
Monocytes Absolute: 0.8 10*3/uL (ref 0.1–1.0)
Monocytes Relative: 7 %
Neutro Abs: 10.7 10*3/uL — ABNORMAL HIGH (ref 1.7–7.7)
Neutrophils Relative %: 87 %
Platelets: 203 10*3/uL (ref 150–400)
RBC: 4.09 MIL/uL (ref 3.87–5.11)
RDW: 12.3 % (ref 11.5–15.5)
WBC: 12.4 10*3/uL — ABNORMAL HIGH (ref 4.0–10.5)
nRBC: 0 % (ref 0.0–0.2)

## 2021-11-09 LAB — URINALYSIS, ROUTINE W REFLEX MICROSCOPIC
Bilirubin Urine: NEGATIVE
Glucose, UA: NEGATIVE mg/dL
Ketones, ur: NEGATIVE mg/dL
Nitrite: POSITIVE — AB
Protein, ur: NEGATIVE mg/dL
Specific Gravity, Urine: 1.02 (ref 1.005–1.030)
pH: 7 (ref 5.0–8.0)

## 2021-11-09 LAB — BASIC METABOLIC PANEL
Anion gap: 7 (ref 5–15)
BUN: 20 mg/dL (ref 6–20)
CO2: 22 mmol/L (ref 22–32)
Calcium: 8.9 mg/dL (ref 8.9–10.3)
Chloride: 106 mmol/L (ref 98–111)
Creatinine, Ser: 0.79 mg/dL (ref 0.44–1.00)
GFR, Estimated: >60 mL/min (ref >60)
Glucose, Bld: 108 mg/dL — ABNORMAL HIGH (ref 70–99)
Potassium: 3.8 mmol/L (ref 3.5–5.1)
Sodium: 135 mmol/L (ref 135–145)

## 2021-11-09 MED ORDER — NITROFURANTOIN MONOHYD MACRO 100 MG PO CAPS
100.0000 mg | ORAL_CAPSULE | Freq: Once | ORAL | Status: AC
  Administered 2021-11-09: 100 mg via ORAL
  Filled 2021-11-09: qty 1

## 2021-11-09 MED ORDER — LACTATED RINGERS IV BOLUS
1000.0000 mL | Freq: Once | INTRAVENOUS | Status: AC
  Administered 2021-11-09: 1000 mL via INTRAVENOUS

## 2021-11-09 MED ORDER — NAPROXEN 500 MG PO TABS: 500.0000 mg | ORAL_TABLET | Freq: Two times a day (BID) | ORAL | 0 refills | Status: AC

## 2021-11-09 MED ORDER — KETOROLAC TROMETHAMINE 15 MG/ML IJ SOLN
15.0000 mg | Freq: Once | INTRAMUSCULAR | Status: AC
  Administered 2021-11-09: 15 mg via INTRAVENOUS
  Filled 2021-11-09: qty 1

## 2021-11-09 MED ORDER — NITROFURANTOIN MONOHYD MACRO 100 MG PO CAPS: 100.0000 mg | ORAL_CAPSULE | Freq: Two times a day (BID) | ORAL | 0 refills | Status: AC

## 2021-11-09 NOTE — ED Triage Notes (Signed)
C/o chills/body aches since Thursday. C/o lower left flank pain. ?

## 2021-11-09 NOTE — Discharge Instructions (Addendum)
Was negative for kidney stone however we did note that you had a pretty significant urinary tract infection on labs.  I have given you your first dose of antibiotic here in the emergency department, and you can pick up the remainder tomorrow at your pharmacy.  For your back pain, I recommend Tylenol or Motrin you should start feeling better after a few days of antibiotics.  If your pain continues to worsen or your symptoms do not improve, please return to the ED for evaluation. ? ?I have also provided you a referral to gastroenterology for your acid reflux symptoms ?

## 2021-11-09 NOTE — ED Provider Notes (Signed)
?[facility] EMERGENCY DEPARTMENT ?Provider Note ? ? ?CSN: [id number] ?Arrival date & time: 11/09/21  1745 ? ?  ? ?History ? ?Chief Complaint  ?Patient presents with  ? Flank Pain  ? ? ?Kaitlyn Ward is a 51 y.o. female who presents to the ED for evaluation of left flank pain radiating around into her abdomen that for started several days ago.  Patient states pain was mild about 4 5 days ago and was well managed with Tylenol or Motrin.  Pain seemed to resolve for about 1 day and then returned again yesterday more severe.  She describes the pain as throbbing/sharp and severe enough to prevent her from sleeping all night.  Pain is severe enough now that is not responding to Tylenol or over-the-counter medications.  Pain is worse with certain positions.  She also notes that over the last few days, she has had what she describes as describes as flulike symptoms to include subjective fever, chills, nausea and general fatigue.  Yesterday she developed sharp pain in her left flank that she describes as throbbing/sharp and prevented her from sleeping nearly all night.  Currently, her pain is well controlled and she denies pain medications.  She states that when she had COVID in November 2021, she also had this sharp back pain as well although it was not located on 1 side of her body.  She denies cough, congestion, vomiting and diarrhea.  She denies urinary symptoms to include dysuria, hematuria and difficulty urinating. ? ?Flank Pain ? ? ?  ? ?Home Medications ?Prior to Admission medications   ?Medication Sig Start Date End Date Taking? Authorizing Provider  ?naproxen (NAPROSYN) 500 MG tablet Take 1 tablet (500 mg total) by mouth 2 (two) times daily. 11/09/21  Yes Janell Quietonklin, Vicent Febles R, PA-C  ?nitrofurantoin, macrocrystal-monohydrate, (MACROBID) 100 MG capsule Take 1 capsule (100 mg total) by mouth 2 (two) times daily. 11/09/21  Yes Raynald Blendonklin, Chawn Spraggins R, PA-C  ?chlorpheniramine-HYDROcodone (TUSSIONEX PENNKINETIC ER) 10-8  MG/5ML LQCR Take 5 mLs by mouth every 12 (twelve) hours as needed for cough. 10/11/13   Elwin MochaWalden, Blair, MD  ?Cholecalciferol (VITAMIN D) 2000 UNITS CAPS Take 2,000 Units by mouth daily.     [provider]  ?Coenzyme Q10 (COQ-10) 400 MG CAPS Take 400 mg by mouth daily.     [provider]  ?escitalopram (LEXAPRO) 20 MG tablet Take 20 mg by mouth daily.    [provider]  ?escitalopram (LEXAPRO) 20 MG tablet Take 20 mg by mouth daily.    [provider]  ?Omega-3 Fatty Acids (FISH OIL PO) Take 1 capsule by mouth daily.    [provider]  ?ondansetron (ZOFRAN ODT) 4 MG disintegrating tablet 4mg  ODT q4 hours prn nausea/vomit 10/11/13   Elwin MochaWalden, Blair, MD  ?ondansetron (ZOFRAN ODT) 4 MG disintegrating tablet 4mg  ODT q4 hours prn nausea/vomit 10/11/13   Elwin MochaWalden, Blair, MD  ?Probiotic Product (PROBIOTIC DAILY PO) Take 1 tablet by mouth daily.    [provider]  ?   ? ?Allergies    ?Patient has no known allergies.   ? ?Review of Systems   ?Review of Systems  ?Genitourinary:  Positive for flank pain.  ? ?Physical Exam ?Updated Vital Signs ?BP 134/76   Pulse (!) 104   Temp 99.6 ?F (37.6 ?C) (Oral)   Resp 16   Ht 5\' 3"  (1.6 m)   Wt 64.9 kg   LMP 02/02/2012   SpO2 97%   BMI 25.33 kg/m?  ?Physical Exam ?  Vitals and nursing note reviewed.  ?Constitutional:   ?   General: She is not in acute distress. ?   Appearance: She is not ill-appearing.  ?HENT:  ?   Head: Atraumatic.  ?Eyes:  ?   Conjunctiva/sclera: Conjunctivae normal.  ?Cardiovascular:  ?   Rate and Rhythm: Regular rhythm. Tachycardia present.  ?   Pulses: Normal pulses.  ?   Heart sounds: No murmur heard. ?Pulmonary:  ?   Effort: Pulmonary effort is normal. No respiratory distress.  ?   Breath sounds: Normal breath sounds.  ?Abdominal:  ?   General: Abdomen is flat. There is no distension.  ?   Palpations: Abdomen is soft.  ?   Tenderness: There is no abdominal tenderness. There is left CVA tenderness. There is no  right CVA tenderness.  ?   Comments: Patient was nontender to palpation, however describes mild pressure in left quadrants on deep palpation.  Abdomen is otherwise soft, not distended.  ?Musculoskeletal:     ?   General: Normal range of motion.  ?   Cervical back: Normal range of motion.  ?Skin: ?   General: Skin is warm and dry.  ?   Capillary Refill: Capillary refill takes less than 2 seconds.  ?Neurological:  ?   General: No focal deficit present.  ?   Mental Status: She is alert.  ?Psychiatric:     ?   Mood and Affect: Mood normal.  ? ? ?ED Results / Procedures / Treatments   ?Labs ?(all labs ordered are listed, but only abnormal results are displayed) ?Labs Reviewed  ?URINALYSIS, ROUTINE W REFLEX MICROSCOPIC - Abnormal; Notable for the following components:  ?    Result Value  ? APPearance TURBID (*)   ? Hgb urine dipstick MODERATE (*)   ? Nitrite POSITIVE (*)   ? Leukocytes,Ua SMALL (*)   ? All other components within normal limits  ?BASIC METABOLIC PANEL - Abnormal; Notable for the following components:  ? Glucose, Bld 108 (*)   ? All other components within normal limits  ?CBC WITH DIFFERENTIAL/PLATELET - Abnormal; Notable for the following components:  ? WBC 12.4 (*)   ? Neutro Abs 10.7 (*)   ? All other components within normal limits  ?URINALYSIS, MICROSCOPIC (REFLEX) - Abnormal; Notable for the following components:  ? Bacteria, UA MANY (*)   ? All other components within normal limits  ?RESP PANEL BY RT-PCR (FLU A&B, COVID) ARPGX2  ? ? ?EKG ?None ? ?Radiology ?CT Renal Stone Study ? ?Result Date: 11/09/2021 ?CLINICAL DATA:  Left flank pain EXAM: CT ABDOMEN AND PELVIS WITHOUT CONTRAST TECHNIQUE: Multidetector CT imaging of the abdomen and pelvis was performed following the standard protocol without IV contrast. RADIATION DOSE REDUCTION: This exam was performed according to the departmental dose-optimization program which includes automated exposure control, adjustment of the mA and/or kV according to  patient size and/or use of iterative reconstruction technique. COMPARISON:  Ultrasound 12/05/2013 FINDINGS: Lower chest: Included lung bases are clear.  Heart size is normal. Hepatobiliary: Liver is enlarged measuring 20 cm in length. Otherwise unremarkable unenhanced appearance of the liver. No focal liver lesion identified. Partially contracted gallbladder appears within normal limits. No hyperdense gallstone. No biliary dilatation. Pancreas: Unremarkable. No pancreatic ductal dilatation or surrounding inflammatory changes. Spleen: Normal in size without focal abnormality. Adrenals/Urinary Tract: Adrenal glands are unremarkable. Kidneys are normal, without renal calculi, focal lesion, or hydronephrosis. Urinary bladder wall appears diffusely thickened, although is incompletely distended. Stomach/Bowel: Stomach is within normal  limits. Appendix appears normal. No evidence of bowel wall thickening, distention, or inflammatory changes. Vascular/Lymphatic: No significant vascular findings are present. No enlarged abdominal or pelvic lymph nodes. Reproductive: Uterus and bilateral adnexa are unremarkable. Other: No free fluid. No abdominopelvic fluid collection. No pneumoperitoneum. No abdominal wall hernia. Musculoskeletal: No acute or significant osseous findings. IMPRESSION: 1. No acute abdominopelvic findings. Specifically, no evidence of obstructive uropathy. 2. Hepatomegaly. Electronically Signed   By: Duanne Guess D.O.   On: 11/09/2021 18:24   ? ?Procedures ?Procedures  ? ? ?Medications Ordered in ED ?Medications  ?lactated ringers bolus 1,000 mL ( Intravenous Stopped 11/09/21 1935)  ?nitrofurantoin (macrocrystal-monohydrate) (MACROBID) capsule 100 mg (100 mg Oral Given 11/09/21 1953)  ?ketorolac (TORADOL) 15 MG/ML injection 15 mg (15 mg Intravenous Given 11/09/21 1954)  ? ? ?ED Course/ Medical Decision Making/ A&P ?  ?                        ?Medical Decision Making ?Amount and/or Complexity of Data  Reviewed ?Labs: ordered. ?Radiology: ordered. ? ?Risk ?Prescription drug management. ? ? ?History:  ?Per HPI ?Social determinants of health: None ? ?Initial impression: ? ?This patient presents to the ED for concern of f

## 2021-11-09 NOTE — ED Notes (Signed)
Patient transported to CT 

## 2022-05-29 IMAGING — CT CT RENAL STONE PROTOCOL
2 of 4 series · 17 of 46 positions shown, 19 images · non-contrast
Comparison: Ultrasound 12/05/2013

CLINICAL DATA: Left flank pain



[Series 2: axial st · axial · 0.74mm/px · z∈[+874,+1250]mm · 14 of 83 slices shown, 16 images]
[im 4/83  soft-tissue]
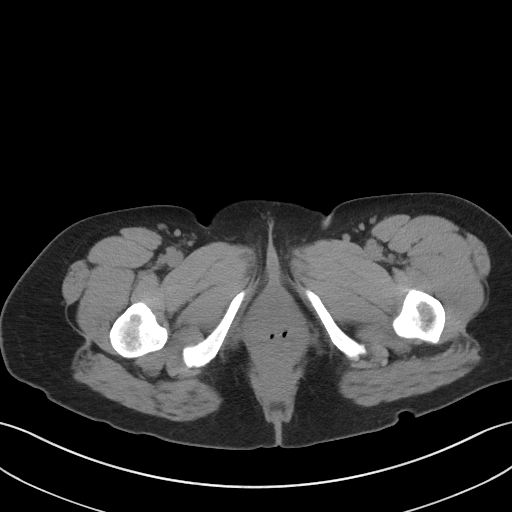
[im 4/83  bone]
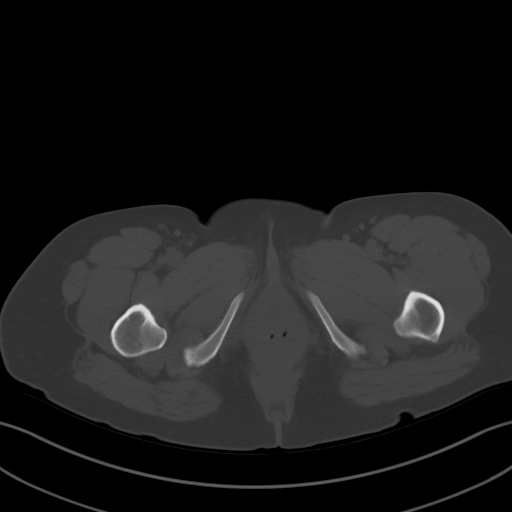
[im 10/83  soft-tissue]
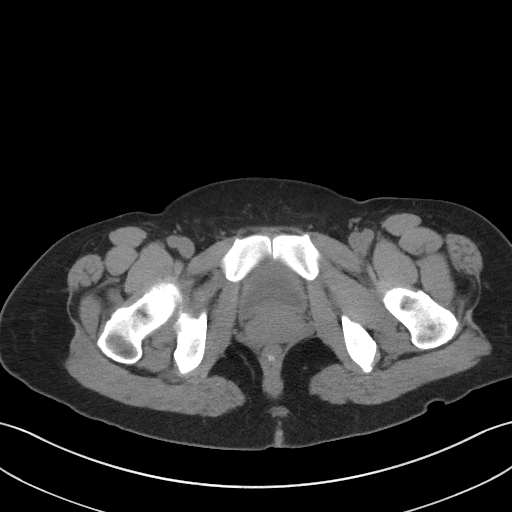
[im 16/83  soft-tissue]
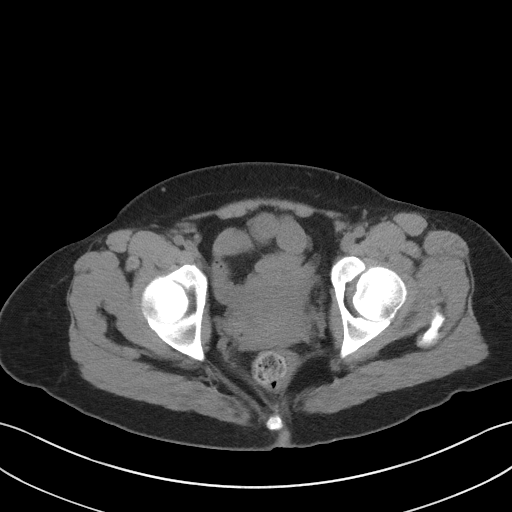
[im 23/83  soft-tissue]
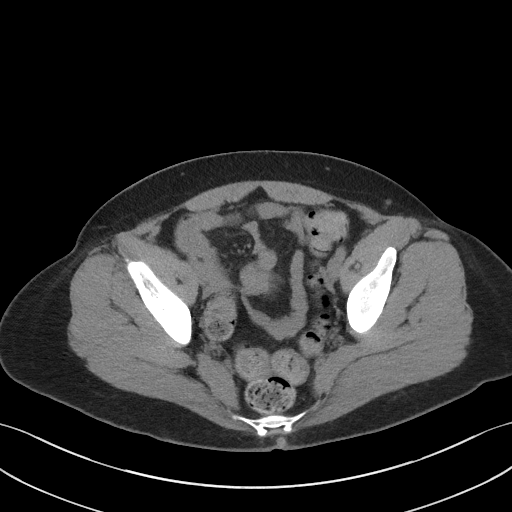
[im 29/83  soft-tissue]
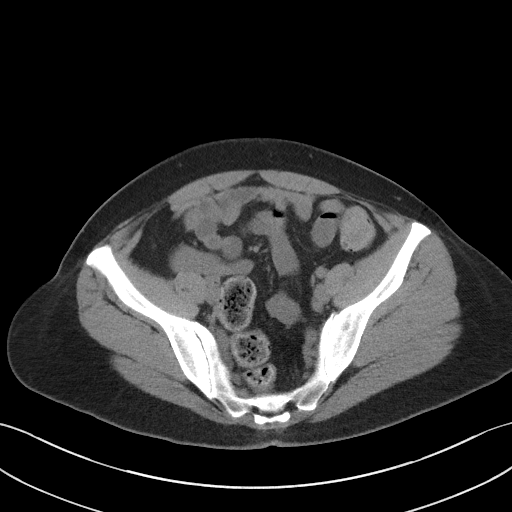
[im 32/83  soft-tissue]
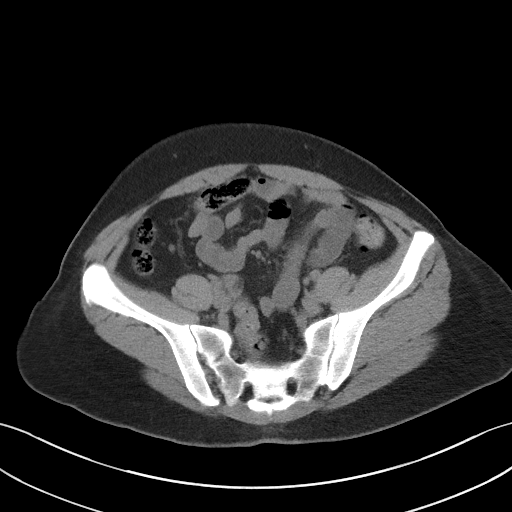
[im 38/83  soft-tissue]
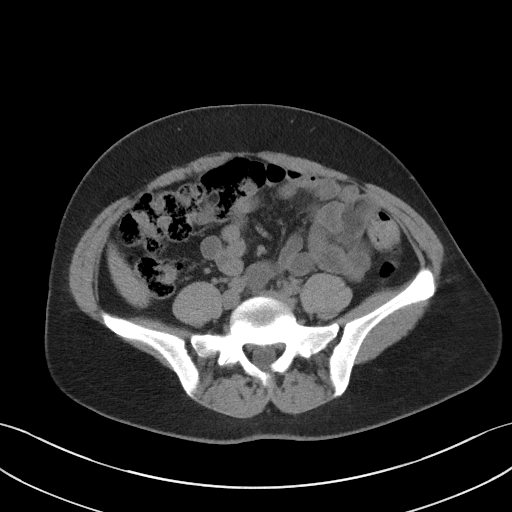
[im 45/83  soft-tissue]
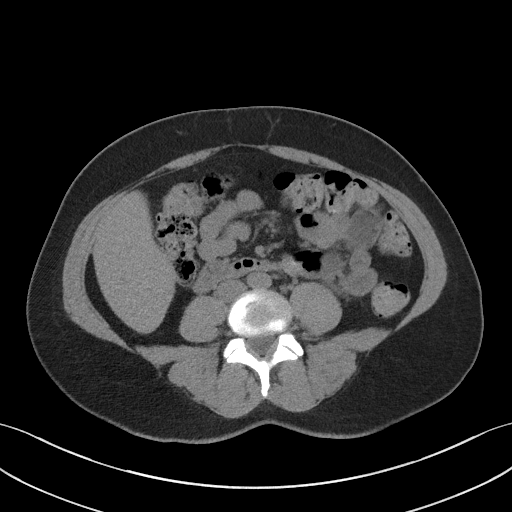
[im 51/83  soft-tissue]
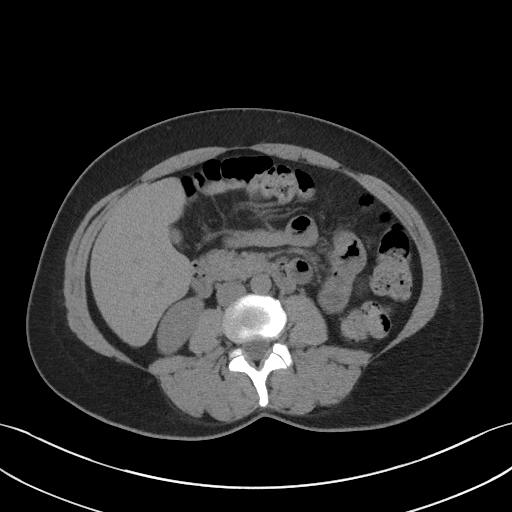
[im 51/83  bone]
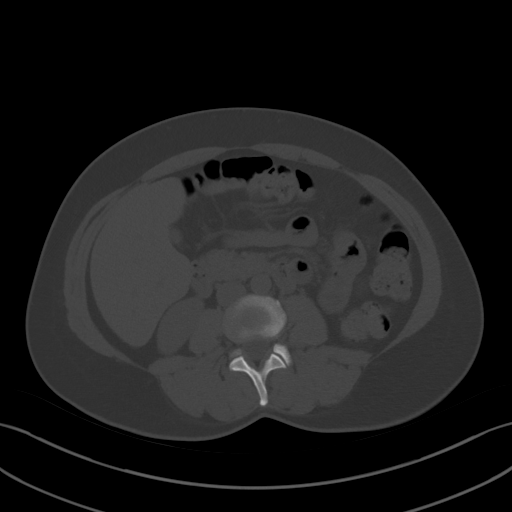
[im 54/83  soft-tissue]
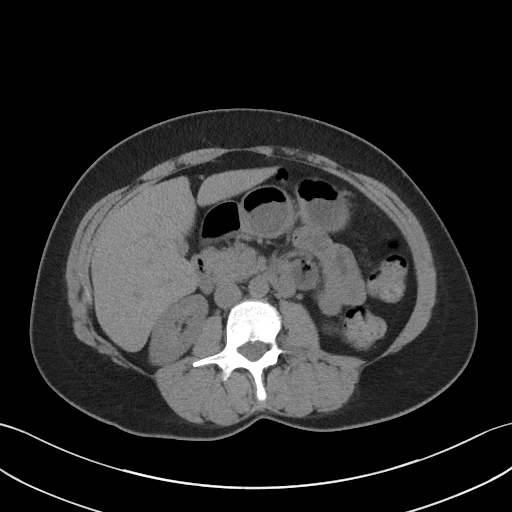
[im 60/83  soft-tissue]
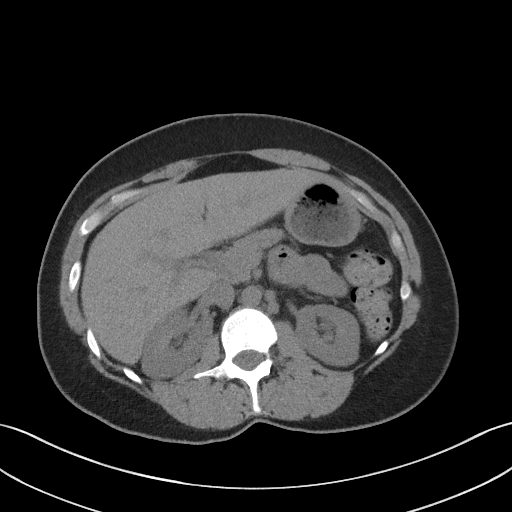
[im 67/83  soft-tissue]
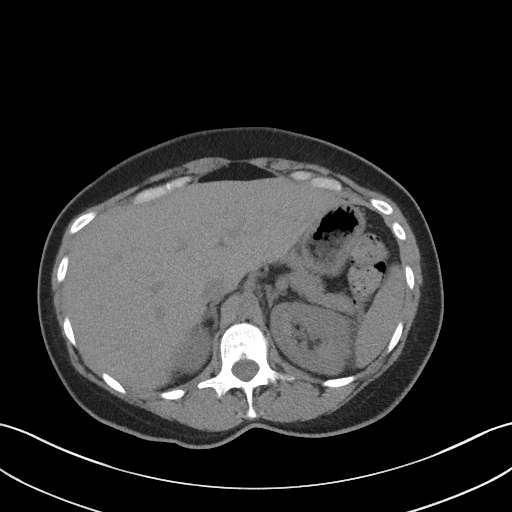
[im 73/83  soft-tissue]
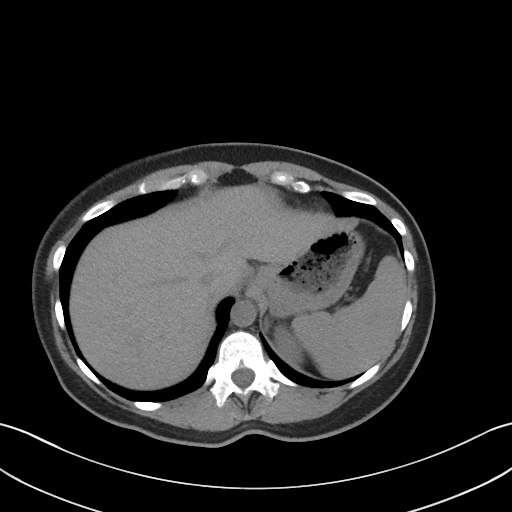
[im 79/83  soft-tissue]
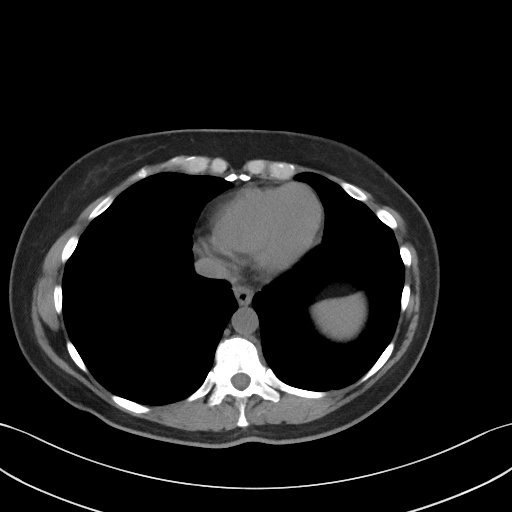

[Series 4: coronal st · coronal · 0.81mm/px · 3 of 79 slices shown]
[im 27/79  soft-tissue]
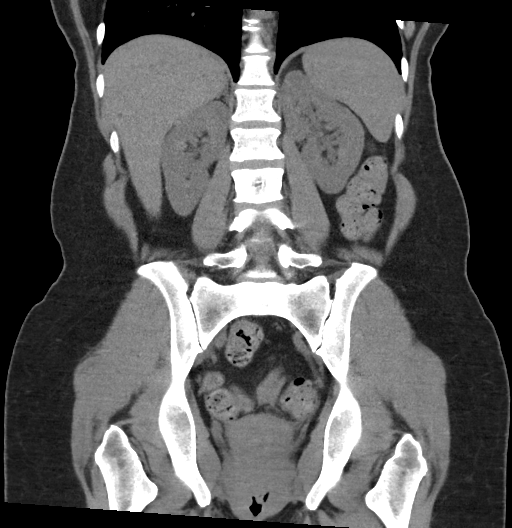
[im 35/79  soft-tissue]
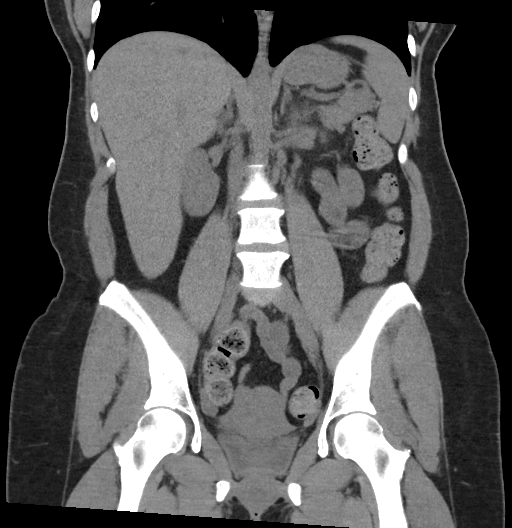
[im 44/79  soft-tissue]
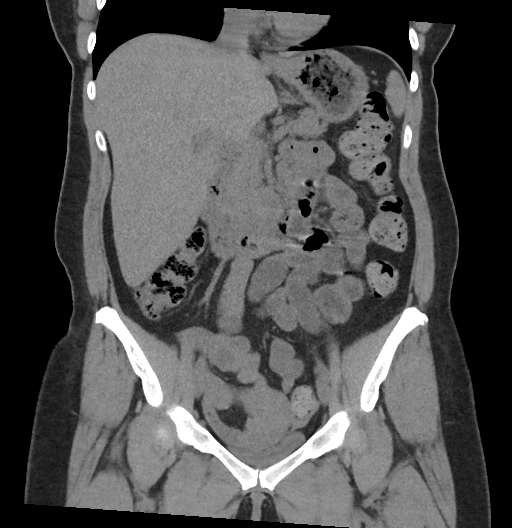

[17 of 46 positions shown; findings below may reference images not displayed]

FINDINGS: Lower chest: Included lung bases are clear.  Heart size is normal.

Hepatobiliary: Liver is enlarged measuring 20 cm in length.
Otherwise unremarkable unenhanced appearance of the liver. No focal
liver lesion identified. Partially contracted gallbladder appears
within normal limits. No hyperdense gallstone. No biliary
dilatation.

Pancreas: Unremarkable. No pancreatic ductal dilatation or
surrounding inflammatory changes.

Spleen: Normal in size without focal abnormality.

Adrenals/Urinary Tract: Adrenal glands are unremarkable. Kidneys are
normal, without renal calculi, focal lesion, or hydronephrosis.
Urinary bladder wall appears diffusely thickened, although is
incompletely distended.

Stomach/Bowel: Stomach is within normal limits. Appendix appears
normal. No evidence of bowel wall thickening, distention, or
inflammatory changes.

Vascular/Lymphatic: No significant vascular findings are present. No
enlarged abdominal or pelvic lymph nodes.

Reproductive: Uterus and bilateral adnexa are unremarkable.

Other: No free fluid. No abdominopelvic fluid collection. No
pneumoperitoneum. No abdominal wall hernia.

Musculoskeletal: No acute or significant osseous findings.
IMPRESSION: 1. No acute abdominopelvic findings. Specifically, no evidence of
obstructive uropathy.
2. Hepatomegaly.
